# Patient Record
Sex: Female | Born: 1935 | Race: Black or African American | Hispanic: No | Marital: Single | Smoking: Former smoker
Health system: Southern US, Community
[De-identification: ages and names within clinical notes are randomized; demographics above are authoritative.]

## PROBLEM LIST (undated history)

## (undated) DIAGNOSIS — E785 Hyperlipidemia, unspecified: Secondary | ICD-10-CM

## (undated) DIAGNOSIS — I480 Paroxysmal atrial fibrillation: Secondary | ICD-10-CM

## (undated) DIAGNOSIS — I509 Heart failure, unspecified: Secondary | ICD-10-CM

## (undated) DIAGNOSIS — K922 Gastrointestinal hemorrhage, unspecified: Secondary | ICD-10-CM

## (undated) DIAGNOSIS — I5032 Chronic diastolic (congestive) heart failure: Secondary | ICD-10-CM

## (undated) DIAGNOSIS — M21372 Foot drop, left foot: Secondary | ICD-10-CM

## (undated) DIAGNOSIS — G7000 Myasthenia gravis without (acute) exacerbation: Secondary | ICD-10-CM

## (undated) DIAGNOSIS — K5790 Diverticulosis of intestine, part unspecified, without perforation or abscess without bleeding: Secondary | ICD-10-CM

## (undated) DIAGNOSIS — M109 Gout, unspecified: Secondary | ICD-10-CM

## (undated) DIAGNOSIS — I10 Essential (primary) hypertension: Secondary | ICD-10-CM

## (undated) DIAGNOSIS — M1990 Unspecified osteoarthritis, unspecified site: Secondary | ICD-10-CM

## (undated) DIAGNOSIS — N189 Chronic kidney disease, unspecified: Secondary | ICD-10-CM

## (undated) DIAGNOSIS — J45909 Unspecified asthma, uncomplicated: Secondary | ICD-10-CM

## (undated) DIAGNOSIS — I351 Nonrheumatic aortic (valve) insufficiency: Secondary | ICD-10-CM

## (undated) DIAGNOSIS — E079 Disorder of thyroid, unspecified: Secondary | ICD-10-CM

## (undated) HISTORY — DX: Chronic diastolic (congestive) heart failure: I50.32

## (undated) HISTORY — DX: Foot drop, left foot: M21.372

## (undated) HISTORY — PX: CATARACT EXTRACTION: SUR2

## (undated) HISTORY — DX: Unspecified osteoarthritis, unspecified site: M19.90

## (undated) HISTORY — PX: COLONOSCOPY: SHX174

## (undated) HISTORY — DX: Hyperlipidemia, unspecified: E78.5

## (undated) HISTORY — DX: Diverticulosis of intestine, part unspecified, without perforation or abscess without bleeding: K57.90

## (undated) HISTORY — DX: Disorder of thyroid, unspecified: E07.9

## (undated) HISTORY — DX: Gastrointestinal hemorrhage, unspecified: K92.2

## (undated) HISTORY — DX: Paroxysmal atrial fibrillation: I48.0

## (undated) HISTORY — DX: Essential (primary) hypertension: I10

## (undated) HISTORY — DX: Myasthenia gravis without (acute) exacerbation: G70.00

## (undated) HISTORY — DX: Unspecified asthma, uncomplicated: J45.909

## (undated) HISTORY — DX: Nonrheumatic aortic (valve) insufficiency: I35.1

## (undated) HISTORY — DX: Chronic kidney disease, unspecified: N18.9

## (undated) HISTORY — DX: Gout, unspecified: M10.9

## (undated) HISTORY — DX: Heart failure, unspecified: I50.9

---

## 2004-11-20 ENCOUNTER — Ambulatory Visit: Payer: Self-pay | Admitting: Family Medicine

## 2004-12-31 ENCOUNTER — Ambulatory Visit: Payer: Self-pay | Admitting: Family Medicine

## 2005-10-08 ENCOUNTER — Ambulatory Visit: Payer: Self-pay | Admitting: Nurse Practitioner

## 2006-02-25 ENCOUNTER — Ambulatory Visit: Payer: Self-pay | Admitting: Nurse Practitioner

## 2006-09-11 ENCOUNTER — Other Ambulatory Visit: Payer: Self-pay

## 2006-09-11 ENCOUNTER — Emergency Department: Payer: Self-pay | Admitting: Emergency Medicine

## 2007-03-08 ENCOUNTER — Ambulatory Visit: Payer: Self-pay | Admitting: *Deleted

## 2007-12-27 ENCOUNTER — Emergency Department: Payer: Self-pay | Admitting: Internal Medicine

## 2008-02-25 ENCOUNTER — Inpatient Hospital Stay: Payer: Self-pay | Admitting: Internal Medicine

## 2008-03-21 ENCOUNTER — Ambulatory Visit: Payer: Self-pay | Admitting: *Deleted

## 2008-10-04 ENCOUNTER — Ambulatory Visit: Payer: Self-pay | Admitting: Family Medicine

## 2008-10-22 ENCOUNTER — Ambulatory Visit: Payer: Self-pay | Admitting: Internal Medicine

## 2009-08-15 ENCOUNTER — Emergency Department: Payer: Self-pay | Admitting: Emergency Medicine

## 2009-09-30 ENCOUNTER — Ambulatory Visit: Payer: Self-pay | Admitting: Otolaryngology

## 2010-02-27 ENCOUNTER — Ambulatory Visit: Payer: Self-pay | Admitting: Neurology

## 2011-04-29 ENCOUNTER — Ambulatory Visit: Payer: Self-pay | Admitting: Orthopedic Surgery

## 2012-02-21 ENCOUNTER — Inpatient Hospital Stay: Payer: Self-pay | Admitting: Internal Medicine

## 2012-02-21 LAB — URINALYSIS, COMPLETE
Blood: NEGATIVE
Glucose,UR: NEGATIVE mg/dL (ref 0–75)
Leukocyte Esterase: NEGATIVE
Nitrite: NEGATIVE
Ph: 5 (ref 4.5–8.0)
Protein: NEGATIVE
Specific Gravity: 1.013 (ref 1.003–1.030)
Squamous Epithelial: 1

## 2012-02-21 LAB — COMPREHENSIVE METABOLIC PANEL
Albumin: 3.4 g/dL (ref 3.4–5.0)
Alkaline Phosphatase: 66 U/L (ref 50–136)
BUN: 35 mg/dL — ABNORMAL HIGH (ref 7–18)
Calcium, Total: 9.1 mg/dL (ref 8.5–10.1)
EGFR (Non-African Amer.): 34 — ABNORMAL LOW
SGOT(AST): 20 U/L (ref 15–37)
Sodium: 143 mmol/L (ref 136–145)
Total Protein: 7.3 g/dL (ref 6.4–8.2)

## 2012-02-21 LAB — CBC
HCT: 33.5 % — ABNORMAL LOW (ref 35.0–47.0)
MCH: 27.5 pg (ref 26.0–34.0)
MCV: 85 fL (ref 80–100)
Platelet: 242 10*3/uL (ref 150–440)
RDW: 15.7 % — ABNORMAL HIGH (ref 11.5–14.5)
WBC: 10 10*3/uL (ref 3.6–11.0)

## 2012-02-22 LAB — HEMOGLOBIN A1C: Hemoglobin A1C: 6.1 % (ref 4.2–6.3)

## 2012-02-22 LAB — CBC WITH DIFFERENTIAL/PLATELET
Basophil %: 0.6 %
Eosinophil %: 0.1 %
HGB: 10.5 g/dL — ABNORMAL LOW (ref 12.0–16.0)
Lymphocyte %: 17.8 %
MCH: 28 pg (ref 26.0–34.0)
MCV: 85 fL (ref 80–100)
Monocyte #: 0.6 x10 3/mm (ref 0.2–0.9)
Neutrophil %: 74.3 %
RBC: 3.75 10*6/uL — ABNORMAL LOW (ref 3.80–5.20)

## 2012-02-22 LAB — BASIC METABOLIC PANEL
Anion Gap: 9 (ref 7–16)
Calcium, Total: 9.1 mg/dL (ref 8.5–10.1)
Chloride: 109 mmol/L — ABNORMAL HIGH (ref 98–107)
Co2: 28 mmol/L (ref 21–32)
Creatinine: 1.27 mg/dL (ref 0.60–1.30)
EGFR (African American): 47 — ABNORMAL LOW
EGFR (Non-African Amer.): 40 — ABNORMAL LOW
Glucose: 95 mg/dL (ref 65–99)
Sodium: 146 mmol/L — ABNORMAL HIGH (ref 136–145)

## 2012-02-22 LAB — HEMOGLOBIN: HGB: 9.7 g/dL — ABNORMAL LOW (ref 12.0–16.0)

## 2012-02-23 LAB — CBC WITH DIFFERENTIAL/PLATELET
Basophil #: 0.1 10*3/uL (ref 0.0–0.1)
Basophil #: 0 10*3/uL (ref 0.0–0.1)
Basophil %: 0.3 %
Eosinophil #: 0 10*3/uL (ref 0.0–0.7)
Eosinophil %: 0.4 %
Eosinophil %: 0 %
HCT: 29.8 % — ABNORMAL LOW (ref 35.0–47.0)
HCT: 31.7 % — ABNORMAL LOW (ref 35.0–47.0)
HGB: 9.8 g/dL — ABNORMAL LOW (ref 12.0–16.0)
Lymphocyte %: 23.3 %
Lymphocyte %: 16.6 %
MCH: 26.8 pg (ref 26.0–34.0)
MCHC: 30.8 g/dL — ABNORMAL LOW (ref 32.0–36.0)
MCV: 86 fL (ref 80–100)
MCV: 87 fL (ref 80–100)
Monocyte #: 0.7 x10 3/mm (ref 0.2–0.9)
Monocyte %: 10.9 %
Neutrophil #: 5 10*3/uL (ref 1.4–6.5)
Neutrophil #: 6.9 10*3/uL — ABNORMAL HIGH (ref 1.4–6.5)
Neutrophil %: 64.6 %
Neutrophil %: 75.4 %
RBC: 3.48 10*6/uL — ABNORMAL LOW (ref 3.80–5.20)
RBC: 3.65 10*6/uL — ABNORMAL LOW (ref 3.80–5.20)
RDW: 15.8 % — ABNORMAL HIGH (ref 11.5–14.5)
RDW: 15.3 % — ABNORMAL HIGH (ref 11.5–14.5)
WBC: 7.7 10*3/uL (ref 3.6–11.0)
WBC: 9.1 10*3/uL (ref 3.6–11.0)

## 2012-02-24 LAB — CBC WITH DIFFERENTIAL/PLATELET
Basophil #: 0 10*3/uL (ref 0.0–0.1)
Eosinophil #: 0 10*3/uL (ref 0.0–0.7)
HCT: 28.1 % — ABNORMAL LOW (ref 35.0–47.0)
HGB: 9.1 g/dL — ABNORMAL LOW (ref 12.0–16.0)
Lymphocyte #: 2.2 10*3/uL (ref 1.0–3.6)
Lymphocyte %: 25.7 %
MCH: 27.8 pg (ref 26.0–34.0)
MCHC: 32.3 g/dL (ref 32.0–36.0)
Neutrophil #: 5.3 10*3/uL (ref 1.4–6.5)
Platelet: 202 10*3/uL (ref 150–440)

## 2012-02-24 LAB — BASIC METABOLIC PANEL
Anion Gap: 6 — ABNORMAL LOW (ref 7–16)
BUN: 19 mg/dL — ABNORMAL HIGH (ref 7–18)
Calcium, Total: 8.9 mg/dL (ref 8.5–10.1)
Chloride: 111 mmol/L — ABNORMAL HIGH (ref 98–107)
Co2: 29 mmol/L (ref 21–32)
Glucose: 93 mg/dL (ref 65–99)
Osmolality: 293 (ref 275–301)
Potassium: 3.6 mmol/L (ref 3.5–5.1)

## 2012-02-24 LAB — HEMOGLOBIN
HGB: 9.7 g/dL — ABNORMAL LOW (ref 12.0–16.0)
HGB: 9.5 g/dL — ABNORMAL LOW (ref 12.0–16.0)

## 2012-02-25 LAB — CBC WITH DIFFERENTIAL/PLATELET
Basophil #: 0 10*3/uL (ref 0.0–0.1)
Eosinophil #: 0 10*3/uL (ref 0.0–0.7)
HCT: 28.4 % — ABNORMAL LOW (ref 35.0–47.0)
Lymphocyte %: 28.1 %
MCHC: 32.2 g/dL (ref 32.0–36.0)
Monocyte %: 11.8 %
Neutrophil #: 4.6 10*3/uL (ref 1.4–6.5)
Neutrophil %: 59.4 %
RBC: 3.25 10*6/uL — ABNORMAL LOW (ref 3.80–5.20)
RDW: 15.8 % — ABNORMAL HIGH (ref 11.5–14.5)
WBC: 7.8 10*3/uL (ref 3.6–11.0)

## 2012-02-25 LAB — BASIC METABOLIC PANEL
Anion Gap: 6 — ABNORMAL LOW (ref 7–16)
Calcium, Total: 8.7 mg/dL (ref 8.5–10.1)
Chloride: 107 mmol/L (ref 98–107)
Co2: 31 mmol/L (ref 21–32)
Creatinine: 1.4 mg/dL — ABNORMAL HIGH (ref 0.60–1.30)
EGFR (African American): 42 — ABNORMAL LOW
EGFR (Non-African Amer.): 36 — ABNORMAL LOW
Potassium: 3.6 mmol/L (ref 3.5–5.1)

## 2012-02-26 LAB — BASIC METABOLIC PANEL
Anion Gap: 6 — ABNORMAL LOW (ref 7–16)
BUN: 25 mg/dL — ABNORMAL HIGH (ref 7–18)
Calcium, Total: 8.7 mg/dL (ref 8.5–10.1)
Chloride: 106 mmol/L (ref 98–107)
Co2: 31 mmol/L (ref 21–32)
Creatinine: 1.61 mg/dL — ABNORMAL HIGH (ref 0.60–1.30)
EGFR (African American): 35 — ABNORMAL LOW
EGFR (Non-African Amer.): 30 — ABNORMAL LOW
Glucose: 93 mg/dL (ref 65–99)
Osmolality: 289 (ref 275–301)
Potassium: 3.6 mmol/L (ref 3.5–5.1)
Sodium: 143 mmol/L (ref 136–145)

## 2012-02-26 LAB — CBC WITH DIFFERENTIAL/PLATELET
Basophil #: 0 10*3/uL (ref 0.0–0.1)
Basophil %: 0.4 %
Eosinophil #: 0 10*3/uL (ref 0.0–0.7)
Eosinophil %: 0.4 %
HCT: 27.1 % — ABNORMAL LOW (ref 35.0–47.0)
HGB: 8.6 g/dL — ABNORMAL LOW (ref 12.0–16.0)
Lymphocyte #: 2.1 10*3/uL (ref 1.0–3.6)
Lymphocyte %: 27.2 %
MCH: 27.6 pg (ref 26.0–34.0)
MCHC: 31.7 g/dL — ABNORMAL LOW (ref 32.0–36.0)
MCV: 87 fL (ref 80–100)
Monocyte #: 0.8 x10 3/mm (ref 0.2–0.9)
Monocyte %: 10.5 %
Neutrophil #: 4.6 10*3/uL (ref 1.4–6.5)
Neutrophil %: 61.5 %
Platelet: 186 10*3/uL (ref 150–440)
RBC: 3.11 10*6/uL — ABNORMAL LOW (ref 3.80–5.20)
RDW: 15.5 % — ABNORMAL HIGH (ref 11.5–14.5)
WBC: 7.5 10*3/uL (ref 3.6–11.0)

## 2012-02-27 LAB — CBC WITH DIFFERENTIAL/PLATELET
Basophil #: 0 10*3/uL (ref 0.0–0.1)
Eosinophil #: 0 10*3/uL (ref 0.0–0.7)
HCT: 26.9 % — ABNORMAL LOW (ref 35.0–47.0)
Lymphocyte #: 1.8 10*3/uL (ref 1.0–3.6)
Lymphocyte %: 22.5 %
MCV: 87 fL (ref 80–100)
Monocyte %: 11.4 %
Neutrophil #: 5.2 10*3/uL (ref 1.4–6.5)
Platelet: 189 10*3/uL (ref 150–440)
RBC: 3.11 10*6/uL — ABNORMAL LOW (ref 3.80–5.20)
RDW: 15.8 % — ABNORMAL HIGH (ref 11.5–14.5)
WBC: 8 10*3/uL (ref 3.6–11.0)

## 2012-02-27 LAB — BASIC METABOLIC PANEL
Calcium, Total: 8.7 mg/dL (ref 8.5–10.1)
Chloride: 106 mmol/L (ref 98–107)
Creatinine: 1.56 mg/dL — ABNORMAL HIGH (ref 0.60–1.30)
EGFR (African American): 37 — ABNORMAL LOW
EGFR (Non-African Amer.): 31 — ABNORMAL LOW
Glucose: 90 mg/dL (ref 65–99)

## 2012-03-07 LAB — BASIC METABOLIC PANEL
Anion Gap: 10 (ref 7–16)
Calcium, Total: 8.9 mg/dL (ref 8.5–10.1)
Chloride: 98 mmol/L (ref 98–107)
EGFR (African American): 24 — ABNORMAL LOW
EGFR (Non-African Amer.): 20 — ABNORMAL LOW
Glucose: 99 mg/dL (ref 65–99)
Osmolality: 291 (ref 275–301)
Sodium: 142 mmol/L (ref 136–145)

## 2012-03-07 LAB — CBC
HCT: 26.1 % — ABNORMAL LOW (ref 35.0–47.0)
HGB: 8.2 g/dL — ABNORMAL LOW (ref 12.0–16.0)
MCHC: 31.4 g/dL — ABNORMAL LOW (ref 32.0–36.0)
MCV: 87 fL (ref 80–100)
RBC: 2.99 10*6/uL — ABNORMAL LOW (ref 3.80–5.20)
WBC: 11.8 10*3/uL — ABNORMAL HIGH (ref 3.6–11.0)

## 2012-03-07 LAB — URINALYSIS, COMPLETE
Ketone: NEGATIVE
Leukocyte Esterase: NEGATIVE
Ph: 5 (ref 4.5–8.0)
Protein: NEGATIVE
Squamous Epithelial: 30

## 2012-03-08 LAB — COMPREHENSIVE METABOLIC PANEL
Albumin: 2.5 g/dL — ABNORMAL LOW (ref 3.4–5.0)
Alkaline Phosphatase: 41 U/L — ABNORMAL LOW (ref 50–136)
Anion Gap: 5 — ABNORMAL LOW (ref 7–16)
BUN: 35 mg/dL — ABNORMAL HIGH (ref 7–18)
Calcium, Total: 8.3 mg/dL — ABNORMAL LOW (ref 8.5–10.1)
EGFR (Non-African Amer.): 22 — ABNORMAL LOW
Glucose: 86 mg/dL (ref 65–99)
SGOT(AST): 15 U/L (ref 15–37)
Sodium: 144 mmol/L (ref 136–145)
Total Protein: 5.3 g/dL — ABNORMAL LOW (ref 6.4–8.2)

## 2012-03-08 LAB — CBC WITH DIFFERENTIAL/PLATELET
Basophil #: 0 10*3/uL (ref 0.0–0.1)
HCT: 22.9 % — ABNORMAL LOW (ref 35.0–47.0)
HGB: 7.3 g/dL — ABNORMAL LOW (ref 12.0–16.0)
Lymphocyte #: 1.4 10*3/uL (ref 1.0–3.6)
MCH: 28.1 pg (ref 26.0–34.0)
MCV: 88 fL (ref 80–100)
Monocyte #: 1.3 x10 3/mm — ABNORMAL HIGH (ref 0.2–0.9)
Platelet: 167 10*3/uL (ref 150–440)
RDW: 16.2 % — ABNORMAL HIGH (ref 11.5–14.5)

## 2012-03-09 ENCOUNTER — Inpatient Hospital Stay: Payer: Self-pay | Admitting: Internal Medicine

## 2012-03-09 LAB — HEMOGLOBIN: HGB: 8.2 g/dL — ABNORMAL LOW (ref 12.0–16.0)

## 2012-03-09 LAB — BASIC METABOLIC PANEL
Anion Gap: 4 — ABNORMAL LOW (ref 7–16)
BUN: 30 mg/dL — ABNORMAL HIGH (ref 7–18)
Calcium, Total: 8.3 mg/dL — ABNORMAL LOW (ref 8.5–10.1)
Chloride: 106 mmol/L (ref 98–107)
Creatinine: 1.92 mg/dL — ABNORMAL HIGH (ref 0.60–1.30)
EGFR (African American): 28 — ABNORMAL LOW
EGFR (Non-African Amer.): 25 — ABNORMAL LOW
Glucose: 102 mg/dL — ABNORMAL HIGH (ref 65–99)
Osmolality: 291 (ref 275–301)
Potassium: 4.2 mmol/L (ref 3.5–5.1)
Sodium: 143 mmol/L (ref 136–145)

## 2012-03-10 LAB — CBC WITH DIFFERENTIAL/PLATELET
Basophil %: 0.3 %
Eosinophil #: 0.1 10*3/uL (ref 0.0–0.7)
Eosinophil %: 1.1 %
HCT: 23.6 % — ABNORMAL LOW (ref 35.0–47.0)
Lymphocyte %: 12.9 %
Monocyte #: 1 x10 3/mm — ABNORMAL HIGH (ref 0.2–0.9)
Monocyte %: 11.8 %
Neutrophil #: 6.1 10*3/uL (ref 1.4–6.5)
Neutrophil %: 73.9 %
Platelet: 192 10*3/uL (ref 150–440)
RBC: 2.72 10*6/uL — ABNORMAL LOW (ref 3.80–5.20)
WBC: 8.3 10*3/uL (ref 3.6–11.0)

## 2012-03-10 LAB — BASIC METABOLIC PANEL
Anion Gap: 4 — ABNORMAL LOW (ref 7–16)
Chloride: 106 mmol/L (ref 98–107)
Co2: 32 mmol/L (ref 21–32)
Creatinine: 1.71 mg/dL — ABNORMAL HIGH (ref 0.60–1.30)
Potassium: 4.5 mmol/L (ref 3.5–5.1)
Sodium: 142 mmol/L (ref 136–145)

## 2012-03-11 LAB — CBC WITH DIFFERENTIAL/PLATELET
Basophil #: 0 10*3/uL (ref 0.0–0.1)
Basophil %: 0.4 %
HCT: 24.9 % — ABNORMAL LOW (ref 35.0–47.0)
Lymphocyte #: 1.4 10*3/uL (ref 1.0–3.6)
Lymphocyte %: 18.3 %
Monocyte %: 12.7 %
Neutrophil #: 5.2 10*3/uL (ref 1.4–6.5)
Neutrophil %: 67.4 %
Platelet: 210 10*3/uL (ref 150–440)
RDW: 15.4 % — ABNORMAL HIGH (ref 11.5–14.5)
WBC: 7.7 10*3/uL (ref 3.6–11.0)

## 2012-05-17 LAB — CBC WITH DIFFERENTIAL/PLATELET
Basophil #: 0 10*3/uL (ref 0.0–0.1)
Basophil %: 0.7 %
Eosinophil #: 0.1 10*3/uL (ref 0.0–0.7)
HGB: 9.2 g/dL — ABNORMAL LOW (ref 12.0–16.0)
Lymphocyte %: 20 %
MCH: 29.5 pg (ref 26.0–34.0)
MCHC: 33.6 g/dL (ref 32.0–36.0)
Monocyte #: 0.7 x10 3/mm (ref 0.2–0.9)
Monocyte %: 11 %
Neutrophil %: 67.4 %
Platelet: 161 10*3/uL (ref 150–440)
RBC: 3.13 10*6/uL — ABNORMAL LOW (ref 3.80–5.20)

## 2012-05-17 LAB — BASIC METABOLIC PANEL
Anion Gap: 8 (ref 7–16)
Calcium, Total: 9.3 mg/dL (ref 8.5–10.1)
Co2: 28 mmol/L (ref 21–32)
Creatinine: 1.3 mg/dL (ref 0.60–1.30)
EGFR (African American): 46 — ABNORMAL LOW
Osmolality: 291 (ref 275–301)

## 2012-05-17 LAB — PROTIME-INR: Prothrombin Time: 13.1 secs (ref 11.5–14.7)

## 2012-05-18 ENCOUNTER — Inpatient Hospital Stay: Payer: Self-pay | Admitting: Internal Medicine

## 2012-05-18 LAB — CBC WITH DIFFERENTIAL/PLATELET
Basophil %: 0.6 %
Eosinophil #: 0 10*3/uL (ref 0.0–0.7)
Eosinophil %: 0.4 %
HCT: 27.1 % — ABNORMAL LOW (ref 35.0–47.0)
HGB: 9 g/dL — ABNORMAL LOW (ref 12.0–16.0)
Lymphocyte %: 16.7 %
MCH: 29.1 pg (ref 26.0–34.0)
MCHC: 33.4 g/dL (ref 32.0–36.0)
MCV: 87 fL (ref 80–100)
Monocyte #: 0.4 x10 3/mm (ref 0.2–0.9)
Monocyte %: 6.8 %
Neutrophil #: 4.9 10*3/uL (ref 1.4–6.5)
Neutrophil %: 75.5 %
Platelet: 168 10*3/uL (ref 150–440)
RBC: 3.1 10*6/uL — ABNORMAL LOW (ref 3.80–5.20)
WBC: 6.4 10*3/uL (ref 3.6–11.0)

## 2012-05-18 LAB — BASIC METABOLIC PANEL
BUN: 17 mg/dL (ref 7–18)
Calcium, Total: 9.2 mg/dL (ref 8.5–10.1)
EGFR (Non-African Amer.): 40 — ABNORMAL LOW
Glucose: 82 mg/dL (ref 65–99)
Potassium: 4.9 mmol/L (ref 3.5–5.1)
Sodium: 143 mmol/L (ref 136–145)

## 2012-05-18 LAB — HEMOGLOBIN: HGB: 9.8 g/dL — ABNORMAL LOW (ref 12.0–16.0)

## 2012-05-19 LAB — CBC WITH DIFFERENTIAL/PLATELET
Basophil %: 0.5 %
HCT: 26.8 % — ABNORMAL LOW (ref 35.0–47.0)
HGB: 8.9 g/dL — ABNORMAL LOW (ref 12.0–16.0)
Lymphocyte %: 25.1 %
Monocyte #: 0.9 x10 3/mm (ref 0.2–0.9)
Monocyte %: 13.8 %
Platelet: 166 10*3/uL (ref 150–440)
RDW: 16.9 % — ABNORMAL HIGH (ref 11.5–14.5)
WBC: 6.7 10*3/uL (ref 3.6–11.0)

## 2012-06-09 ENCOUNTER — Ambulatory Visit: Payer: Self-pay | Admitting: Gastroenterology

## 2012-08-01 ENCOUNTER — Ambulatory Visit: Payer: Self-pay | Admitting: Gastroenterology

## 2012-08-01 LAB — CBC WITH DIFFERENTIAL/PLATELET
Basophil #: 0.1 10*3/uL (ref 0.0–0.1)
Basophil %: 0.8 %
Eosinophil #: 0.1 10*3/uL (ref 0.0–0.7)
Eosinophil %: 1.4 %
HCT: 33.1 % — ABNORMAL LOW (ref 35.0–47.0)
HGB: 10.8 g/dL — ABNORMAL LOW (ref 12.0–16.0)
MCV: 90 fL (ref 80–100)
Monocyte #: 0.7 x10 3/mm (ref 0.2–0.9)
Monocyte %: 9.5 %
Neutrophil #: 4.7 10*3/uL (ref 1.4–6.5)
RBC: 3.7 10*6/uL — ABNORMAL LOW (ref 3.80–5.20)
WBC: 7.1 10*3/uL (ref 3.6–11.0)

## 2012-08-01 LAB — BASIC METABOLIC PANEL
BUN: 18 mg/dL (ref 7–18)
Calcium, Total: 9 mg/dL (ref 8.5–10.1)
Chloride: 111 mmol/L — ABNORMAL HIGH (ref 98–107)
Co2: 29 mmol/L (ref 21–32)
EGFR (Non-African Amer.): 41 — ABNORMAL LOW
Glucose: 86 mg/dL (ref 65–99)
Osmolality: 288 (ref 275–301)
Potassium: 3.8 mmol/L (ref 3.5–5.1)

## 2012-09-11 ENCOUNTER — Emergency Department: Payer: Self-pay | Admitting: Emergency Medicine

## 2012-09-11 LAB — BASIC METABOLIC PANEL
Co2: 31 mmol/L (ref 21–32)
Creatinine: 1.14 mg/dL (ref 0.60–1.30)
Glucose: 75 mg/dL (ref 65–99)
Osmolality: 287 (ref 275–301)
Potassium: 3.8 mmol/L (ref 3.5–5.1)
Sodium: 144 mmol/L (ref 136–145)

## 2012-09-11 LAB — TROPONIN I: Troponin-I: 0.04 ng/mL

## 2012-09-11 LAB — CBC
HGB: 10.3 g/dL — ABNORMAL LOW (ref 12.0–16.0)
MCHC: 32.4 g/dL (ref 32.0–36.0)
RBC: 3.57 10*6/uL — ABNORMAL LOW (ref 3.80–5.20)
WBC: 8.8 10*3/uL (ref 3.6–11.0)

## 2012-09-11 LAB — CK TOTAL AND CKMB (NOT AT ARMC)
CK, Total: 42 U/L (ref 21–215)
CK-MB: 2.5 ng/mL (ref 0.5–3.6)

## 2012-11-17 LAB — CBC
MCH: 29.2 pg (ref 26.0–34.0)
MCHC: 32.9 g/dL (ref 32.0–36.0)
MCV: 89 fL (ref 80–100)
RBC: 3.67 10*6/uL — ABNORMAL LOW (ref 3.80–5.20)
WBC: 9.5 10*3/uL (ref 3.6–11.0)

## 2012-11-17 LAB — COMPREHENSIVE METABOLIC PANEL
Albumin: 3.1 g/dL — ABNORMAL LOW (ref 3.4–5.0)
BUN: 30 mg/dL — ABNORMAL HIGH (ref 7–18)
Bilirubin,Total: 0.3 mg/dL (ref 0.2–1.0)
Calcium, Total: 8.6 mg/dL (ref 8.5–10.1)
Chloride: 108 mmol/L — ABNORMAL HIGH (ref 98–107)
EGFR (African American): 36 — ABNORMAL LOW
Glucose: 119 mg/dL — ABNORMAL HIGH (ref 65–99)
Osmolality: 290 (ref 275–301)
Potassium: 3.7 mmol/L (ref 3.5–5.1)
SGPT (ALT): 20 U/L (ref 12–78)
Sodium: 142 mmol/L (ref 136–145)
Total Protein: 7 g/dL (ref 6.4–8.2)

## 2012-11-17 LAB — URINALYSIS, COMPLETE
Bilirubin,UR: NEGATIVE
Glucose,UR: NEGATIVE mg/dL (ref 0–75)
Ketone: NEGATIVE
Nitrite: NEGATIVE
Ph: 5 (ref 4.5–8.0)
RBC,UR: 1 /HPF (ref 0–5)
Specific Gravity: 1.013 (ref 1.003–1.030)
Squamous Epithelial: 3
WBC UR: 1 /HPF (ref 0–5)

## 2012-11-17 LAB — CK TOTAL AND CKMB (NOT AT ARMC): CK-MB: 5.3 ng/mL — ABNORMAL HIGH (ref 0.5–3.6)

## 2012-11-18 ENCOUNTER — Inpatient Hospital Stay: Payer: Self-pay | Admitting: Internal Medicine

## 2012-11-18 LAB — CBC
HCT: 30.7 % — ABNORMAL LOW (ref 35.0–47.0)
HGB: 9.8 g/dL — ABNORMAL LOW (ref 12.0–16.0)
MCH: 28.4 pg (ref 26.0–34.0)
MCHC: 32.1 g/dL (ref 32.0–36.0)
WBC: 9.7 10*3/uL (ref 3.6–11.0)

## 2012-11-19 LAB — CBC WITH DIFFERENTIAL/PLATELET
Basophil #: 0.1 10*3/uL (ref 0.0–0.1)
Eosinophil #: 0.1 10*3/uL (ref 0.0–0.7)
Eosinophil %: 1.4 %
HCT: 25.9 % — ABNORMAL LOW (ref 35.0–47.0)
Lymphocyte #: 1.7 10*3/uL (ref 1.0–3.6)
Lymphocyte %: 24.8 %
MCH: 29.5 pg (ref 26.0–34.0)
MCHC: 33.3 g/dL (ref 32.0–36.0)
Monocyte #: 0.7 x10 3/mm (ref 0.2–0.9)
Monocyte %: 10.6 %
Neutrophil %: 62.4 %
Platelet: 166 10*3/uL (ref 150–440)
RBC: 2.92 10*6/uL — ABNORMAL LOW (ref 3.80–5.20)
RDW: 15.7 % — ABNORMAL HIGH (ref 11.5–14.5)
WBC: 7 10*3/uL (ref 3.6–11.0)

## 2012-11-20 LAB — HEMATOCRIT: HCT: 29.6 % — ABNORMAL LOW (ref 35.0–47.0)

## 2012-11-21 LAB — BASIC METABOLIC PANEL
Anion Gap: 0 — ABNORMAL LOW (ref 7–16)
BUN: 12 mg/dL (ref 7–18)
Calcium, Total: 8.5 mg/dL (ref 8.5–10.1)
Co2: 34 mmol/L — ABNORMAL HIGH (ref 21–32)
Creatinine: 1.31 mg/dL — ABNORMAL HIGH (ref 0.60–1.30)
EGFR (African American): 45 — ABNORMAL LOW
Potassium: 3.7 mmol/L (ref 3.5–5.1)

## 2012-11-21 LAB — CBC WITH DIFFERENTIAL/PLATELET
Basophil #: 0 10*3/uL (ref 0.0–0.1)
Basophil %: 0.5 %
Eosinophil #: 0 10*3/uL (ref 0.0–0.7)
Lymphocyte %: 23.9 %
MCH: 29.2 pg (ref 26.0–34.0)
MCV: 90 fL (ref 80–100)
Neutrophil #: 5.3 10*3/uL (ref 1.4–6.5)
RBC: 3.1 10*6/uL — ABNORMAL LOW (ref 3.80–5.20)
RDW: 16.2 % — ABNORMAL HIGH (ref 11.5–14.5)

## 2012-11-21 LAB — HEMATOCRIT: HCT: 27.8 % — ABNORMAL LOW (ref 35.0–47.0)

## 2012-11-22 LAB — HEMOGLOBIN: HGB: 9 g/dL — ABNORMAL LOW (ref 12.0–16.0)

## 2012-11-23 LAB — BASIC METABOLIC PANEL
Anion Gap: 3 — ABNORMAL LOW (ref 7–16)
BUN: 10 mg/dL (ref 7–18)
Calcium, Total: 8.6 mg/dL (ref 8.5–10.1)
Chloride: 107 mmol/L (ref 98–107)
Co2: 36 mmol/L — ABNORMAL HIGH (ref 21–32)
Creatinine: 1.52 mg/dL — ABNORMAL HIGH (ref 0.60–1.30)
Potassium: 3.4 mmol/L — ABNORMAL LOW (ref 3.5–5.1)
Sodium: 146 mmol/L — ABNORMAL HIGH (ref 136–145)

## 2012-11-23 LAB — HEMOGLOBIN: HGB: 8.7 g/dL — ABNORMAL LOW (ref 12.0–16.0)

## 2012-11-24 LAB — BASIC METABOLIC PANEL
Anion Gap: 1 — ABNORMAL LOW (ref 7–16)
BUN: 18 mg/dL (ref 7–18)
Creatinine: 2.07 mg/dL — ABNORMAL HIGH (ref 0.60–1.30)
EGFR (African American): 26 — ABNORMAL LOW
EGFR (Non-African Amer.): 22 — ABNORMAL LOW
Glucose: 100 mg/dL — ABNORMAL HIGH (ref 65–99)
Osmolality: 283 (ref 275–301)
Sodium: 141 mmol/L (ref 136–145)

## 2012-11-24 LAB — HEMOGLOBIN: HGB: 8.5 g/dL — ABNORMAL LOW (ref 12.0–16.0)

## 2012-11-25 LAB — BASIC METABOLIC PANEL
BUN: 19 mg/dL — ABNORMAL HIGH (ref 7–18)
Chloride: 105 mmol/L (ref 98–107)
EGFR (African American): 36 — ABNORMAL LOW
EGFR (Non-African Amer.): 31 — ABNORMAL LOW
Glucose: 100 mg/dL — ABNORMAL HIGH (ref 65–99)
Osmolality: 291 (ref 275–301)
Potassium: 3.5 mmol/L (ref 3.5–5.1)
Sodium: 145 mmol/L (ref 136–145)

## 2012-11-26 LAB — BASIC METABOLIC PANEL
BUN: 24 mg/dL — ABNORMAL HIGH (ref 7–18)
Calcium, Total: 9 mg/dL (ref 8.5–10.1)
Chloride: 104 mmol/L (ref 98–107)
Creatinine: 1.52 mg/dL — ABNORMAL HIGH (ref 0.60–1.30)
EGFR (Non-African Amer.): 32 — ABNORMAL LOW
Glucose: 94 mg/dL (ref 65–99)
Potassium: 3.6 mmol/L (ref 3.5–5.1)
Sodium: 145 mmol/L (ref 136–145)

## 2012-11-26 LAB — HEMOGLOBIN: HGB: 8.6 g/dL — ABNORMAL LOW (ref 12.0–16.0)

## 2012-11-27 LAB — COMPREHENSIVE METABOLIC PANEL
Albumin: 2.9 g/dL — ABNORMAL LOW (ref 3.4–5.0)
Alkaline Phosphatase: 40 U/L — ABNORMAL LOW (ref 50–136)
Anion Gap: 1 — ABNORMAL LOW (ref 7–16)
BUN: 24 mg/dL — ABNORMAL HIGH (ref 7–18)
Calcium, Total: 8.8 mg/dL (ref 8.5–10.1)
Co2: 39 mmol/L — ABNORMAL HIGH (ref 21–32)
EGFR (African American): 36 — ABNORMAL LOW
EGFR (Non-African Amer.): 31 — ABNORMAL LOW
Glucose: 93 mg/dL (ref 65–99)
Potassium: 3.8 mmol/L (ref 3.5–5.1)
SGOT(AST): 14 U/L — ABNORMAL LOW (ref 15–37)
SGPT (ALT): 13 U/L (ref 12–78)

## 2012-11-27 LAB — CBC WITH DIFFERENTIAL/PLATELET
Basophil #: 0.1 10*3/uL (ref 0.0–0.1)
Eosinophil #: 0 10*3/uL (ref 0.0–0.7)
HCT: 27.4 % — ABNORMAL LOW (ref 35.0–47.0)
HGB: 8.6 g/dL — ABNORMAL LOW (ref 12.0–16.0)
Lymphocyte %: 23.2 %
MCH: 28.4 pg (ref 26.0–34.0)
MCHC: 31.4 g/dL — ABNORMAL LOW (ref 32.0–36.0)
MCV: 91 fL (ref 80–100)
Neutrophil #: 4.7 10*3/uL (ref 1.4–6.5)
Platelet: 196 10*3/uL (ref 150–440)

## 2012-11-28 LAB — CK TOTAL AND CKMB (NOT AT ARMC)
CK, Total: 35 U/L (ref 21–215)
CK-MB: 2 ng/mL (ref 0.5–3.6)

## 2012-11-28 LAB — BASIC METABOLIC PANEL
Anion Gap: 1 — ABNORMAL LOW (ref 7–16)
BUN: 22 mg/dL — ABNORMAL HIGH (ref 7–18)
Creatinine: 1.5 mg/dL — ABNORMAL HIGH (ref 0.60–1.30)
Osmolality: 290 (ref 275–301)
Potassium: 4.4 mmol/L (ref 3.5–5.1)
Sodium: 144 mmol/L (ref 136–145)

## 2012-11-28 LAB — CBC WITH DIFFERENTIAL/PLATELET
Basophil #: 0.1 10*3/uL (ref 0.0–0.1)
Basophil %: 0.8 %
Eosinophil #: 0 10*3/uL (ref 0.0–0.7)
HCT: 26.9 % — ABNORMAL LOW (ref 35.0–47.0)
MCH: 29.1 pg (ref 26.0–34.0)
MCV: 90 fL (ref 80–100)
Monocyte #: 0.9 x10 3/mm (ref 0.2–0.9)
Neutrophil %: 60.9 %
Platelet: 196 10*3/uL (ref 150–440)
RBC: 2.99 10*6/uL — ABNORMAL LOW (ref 3.80–5.20)
WBC: 6.9 10*3/uL (ref 3.6–11.0)

## 2012-11-28 LAB — MAGNESIUM: Magnesium: 2.1 mg/dL

## 2012-11-29 DIAGNOSIS — I4891 Unspecified atrial fibrillation: Secondary | ICD-10-CM

## 2012-11-29 DIAGNOSIS — I5031 Acute diastolic (congestive) heart failure: Secondary | ICD-10-CM

## 2012-11-29 LAB — CBC WITH DIFFERENTIAL/PLATELET
HCT: 29.4 % — ABNORMAL LOW (ref 35.0–47.0)
HGB: 9.5 g/dL — ABNORMAL LOW (ref 12.0–16.0)
Lymphocyte #: 1.8 10*3/uL (ref 1.0–3.6)
Lymphocyte %: 21.5 %
MCHC: 32.3 g/dL (ref 32.0–36.0)
Monocyte %: 12.3 %
Neutrophil #: 5.5 10*3/uL (ref 1.4–6.5)
Neutrophil %: 64.8 %
RDW: 16 % — ABNORMAL HIGH (ref 11.5–14.5)

## 2012-11-29 LAB — BASIC METABOLIC PANEL
Anion Gap: 3 — ABNORMAL LOW (ref 7–16)
Co2: 39 mmol/L — ABNORMAL HIGH (ref 21–32)
Creatinine: 1.63 mg/dL — ABNORMAL HIGH (ref 0.60–1.30)
EGFR (African American): 34 — ABNORMAL LOW
EGFR (Non-African Amer.): 30 — ABNORMAL LOW
Osmolality: 286 (ref 275–301)
Potassium: 4.3 mmol/L (ref 3.5–5.1)

## 2012-11-29 LAB — MAGNESIUM: Magnesium: 2.2 mg/dL

## 2012-11-30 LAB — CBC WITH DIFFERENTIAL/PLATELET
Basophil #: 0.1 10*3/uL (ref 0.0–0.1)
Basophil %: 0.7 %
Eosinophil #: 0 10*3/uL (ref 0.0–0.7)
Eosinophil %: 0.4 %
Lymphocyte #: 1.8 10*3/uL (ref 1.0–3.6)
MCH: 29.7 pg (ref 26.0–34.0)
MCHC: 33.1 g/dL (ref 32.0–36.0)
MCV: 90 fL (ref 80–100)
Monocyte #: 1 x10 3/mm — ABNORMAL HIGH (ref 0.2–0.9)
Neutrophil #: 5.4 10*3/uL (ref 1.4–6.5)
WBC: 8.3 10*3/uL (ref 3.6–11.0)

## 2012-11-30 LAB — BASIC METABOLIC PANEL
Anion Gap: 0 — ABNORMAL LOW (ref 7–16)
Co2: 39 mmol/L — ABNORMAL HIGH (ref 21–32)
Creatinine: 1.82 mg/dL — ABNORMAL HIGH (ref 0.60–1.30)
EGFR (Non-African Amer.): 26 — ABNORMAL LOW
Potassium: 4.3 mmol/L (ref 3.5–5.1)

## 2012-12-01 ENCOUNTER — Telehealth: Payer: Self-pay

## 2012-12-01 LAB — CBC WITH DIFFERENTIAL/PLATELET
Basophil #: 0 10*3/uL (ref 0.0–0.1)
Basophil %: 0.3 %
Eosinophil #: 0 10*3/uL (ref 0.0–0.7)
Eosinophil %: 0.3 %
HCT: 26.2 % — ABNORMAL LOW (ref 35.0–47.0)
Lymphocyte %: 22.6 %
MCH: 29.7 pg (ref 26.0–34.0)
MCHC: 33 g/dL (ref 32.0–36.0)
MCV: 90 fL (ref 80–100)
Monocyte %: 10.8 %
Neutrophil #: 5.9 10*3/uL (ref 1.4–6.5)
Neutrophil %: 66 %
Platelet: 194 10*3/uL (ref 150–440)
RBC: 2.91 10*6/uL — ABNORMAL LOW (ref 3.80–5.20)
WBC: 8.9 10*3/uL (ref 3.6–11.0)

## 2012-12-01 LAB — BASIC METABOLIC PANEL
Creatinine: 1.93 mg/dL — ABNORMAL HIGH (ref 0.60–1.30)
EGFR (Non-African Amer.): 24 — ABNORMAL LOW
Glucose: 81 mg/dL (ref 65–99)
Potassium: 4.4 mmol/L (ref 3.5–5.1)
Sodium: 141 mmol/L (ref 136–145)

## 2012-12-01 NOTE — Telephone Encounter (Signed)
Message copied by [facility], Jaleel Allen E on Thu Dec 01, 2012  2:31 PM ------      Message from: Coralee Rud      Created: Thu Dec 01, 2012  2:12 PM      Regarding: tcm;/ph       appt dr Kirke Corin 12/08/12 ------

## 2012-12-01 NOTE — Telephone Encounter (Signed)
TCM  

## 2012-12-02 NOTE — Telephone Encounter (Signed)
Patient contacted regarding discharge from [facility] on 11/1712.  Patient understands to follow up with provider Dr. Kirke Corin on 12/08/12 at 1115 at [facility]. Patient understands discharge instructions? yes Patient understands medications and regiment? yes Patient understands to bring all medications to this visit? yes  Pt says she is feeling well since hospital d/c. Denies worsening sob or edema. No palpitations Confirms compliance with meds per d/c instructions. She does admit she realized this am she had 2 RX that were written at d/c that she has not had filled. Her niece has gone to fill these now She has gone through her medication bottles from prior to admission and set aside the ones that are not on hospital list Has Home health and PT that will start soon Also has aide that stays with her most of day as well as neice She will call us prior to appt if needed

## 2012-12-05 ENCOUNTER — Encounter: Payer: Self-pay | Admitting: *Deleted

## 2012-12-07 ENCOUNTER — Encounter: Payer: Self-pay | Admitting: *Deleted

## 2012-12-08 ENCOUNTER — Ambulatory Visit (INDEPENDENT_AMBULATORY_CARE_PROVIDER_SITE_OTHER): Payer: Medicare Other | Admitting: Cardiovascular Disease

## 2012-12-08 ENCOUNTER — Encounter: Payer: Self-pay | Admitting: Cardiovascular Disease

## 2012-12-08 VITALS — BP 170/84 | HR 57 | Ht 66.0 in | Wt 126.2 lb

## 2012-12-08 DIAGNOSIS — I4891 Unspecified atrial fibrillation: Secondary | ICD-10-CM

## 2012-12-08 DIAGNOSIS — I10 Essential (primary) hypertension: Secondary | ICD-10-CM

## 2012-12-08 DIAGNOSIS — I5032 Chronic diastolic (congestive) heart failure: Secondary | ICD-10-CM

## 2012-12-08 DIAGNOSIS — I480 Paroxysmal atrial fibrillation: Secondary | ICD-10-CM

## 2012-12-08 MED ORDER — AMIODARONE HCL 200 MG PO TABS: 200.0000 mg | ORAL_TABLET | Freq: Every day | ORAL | Status: DC

## 2012-12-08 NOTE — Patient Instructions (Addendum)
Continue same medications.  Follow up in 3 months.  

## 2012-12-11 ENCOUNTER — Encounter: Payer: Self-pay | Admitting: Cardiovascular Disease

## 2012-12-11 DIAGNOSIS — I480 Paroxysmal atrial fibrillation: Secondary | ICD-10-CM | POA: Insufficient documentation

## 2012-12-11 DIAGNOSIS — I5032 Chronic diastolic (congestive) heart failure: Secondary | ICD-10-CM | POA: Insufficient documentation

## 2012-12-11 NOTE — Assessment & Plan Note (Signed)
She appears to be euvolemic at this point and is only using Lasix as needed. Recent ejection fraction was normal.

## 2012-12-11 NOTE — Progress Notes (Signed)
HPI  This is a 79 year old female who is here today for a followup visit after recent hospitalization. She presented to [facility] on April 4 with lower GI bleed felt to be due to diverticular bleed. During her hospitalization, she developed severe bradycardia with a syncopal episode. Her heart rate was noted to be in the 30s. She was on carvedilol 12.5 mg twice daily which was discontinued. Subsequently, she developed atrial fibrillation with rapid ventricular response. She was started on amiodarone with restoration of normal sinus rhythm. An echocardiogram was done which showed an ejection fraction of 60-65%, moderate aortic insufficiency and moderate pulmonary hypertension. She did have fluid overload which was treated with Lasix . She had an acute on chronic renal failure which subsequently improved. Since hospital discharge, she has been feeling well. She denies any chest pain or palpitations. She is using Lasix only as needed and reports no significant edema or orthopnea.  No Known Allergies   Current Outpatient Prescriptions on File Prior to Visit  Medication Sig Dispense Refill  . acetaminophen (MAPAP) 325 MG tablet Take 650 mg by mouth every 6 (six) hours as needed for pain.      . ferrous sulfate 325 (65 FE) MG tablet Take 325 mg by mouth 2 (two) times daily.      . furosemide (LASIX) 20 MG tablet Take 20 mg by mouth as needed.      . hydrALAZINE (APRESOLINE) 50 MG tablet Take 50 mg by mouth 2 (two) times daily.      Marland Kitchen omeprazole (PRILOSEC) 20 MG capsule Take 20 mg by mouth daily.      . predniSONE (DELTASONE) 10 MG tablet Take 10 mg by mouth daily.      Marland Kitchen pyridostigmine (MESTINON) 60 MG tablet Take 30 mg by mouth 3 (three) times daily.       No current facility-administered medications on file prior to visit.     Past Medical History  Diagnosis Date  . Asthma   . Diastolic dysfunction   . Chronic kidney disease   . Myasthenia gravis   . Diverticulosis   . Hyperlipidemia   .  Hypertension   . CHF (congestive heart failure)   . Arthritis   . Paroxysmal atrial fibrillation   . Chronic diastolic heart failure     With moderate aortic valve insufficiency and moderate pulmonary hypertension     Past Surgical History  Procedure Laterality Date  . Cataract extraction    . Colonoscopy       Family History  Problem Relation Age of Onset  . Heart disease Sister   . Hypertension Mother   . Hyperlipidemia Mother   . Hypertension Father   . Hyperlipidemia Father      History   Social History  . Marital Status: Single    Spouse Name: N/A    Number of Children: N/A  . Years of Education: N/A   Occupational History  . Not on file.   Social History Main Topics  . Smoking status: Former Smoker -- 0.50 packs/day for 10 years    Types: Cigarettes  . Smokeless tobacco: Not on file  . Alcohol Use: No  . Drug Use: No  . Sexually Active: Not on file   Other Topics Concern  . Not on file   Social History Narrative  . No narrative on file     PHYSICAL EXAM   BP 170/84  Pulse 57  Ht 5\' 6"  (1.676 m)  Wt 126 lb 4  oz (57.267 kg)  BMI 20.39 kg/m2 Constitutional: She is oriented to person, place, and time. She appears well-developed and well-nourished. No distress.  HENT: No nasal discharge.  Head: Normocephalic and atraumatic.  Eyes: Pupils are equal and round. Right eye exhibits no discharge. Left eye exhibits no discharge.  Neck: Normal range of motion. Neck supple. No JVD present. No thyromegaly present.  Cardiovascular: Normal rate, regular rhythm, normal heart sounds. Exam reveals no gallop and no friction rub. No murmur heard.  Pulmonary/Chest: Effort normal and breath sounds normal. No stridor. No respiratory distress. She has no wheezes. She has no rales. She exhibits no tenderness.  Abdominal: Soft. Bowel sounds are normal. She exhibits no distension. There is no tenderness. There is no rebound and no guarding.  Musculoskeletal: Normal range  of motion. She exhibits no edema and no tenderness.  Neurological: She is alert and oriented to person, place, and time. Coordination normal.  Skin: Skin is warm and dry. No rash noted. She is not diaphoretic. No erythema. No pallor.  Psychiatric: She has a normal mood and affect. Her behavior is normal. Judgment and thought content normal.     [id number]:[id number]  Bradycardia  -  No significant ST or T wave changes. Normal QT interval.   ASSESSMENT AND PLAN

## 2012-12-11 NOTE — Assessment & Plan Note (Addendum)
She is currently in normal sinus rhythm with amiodarone. This will be continued at the current dose. This happened recently in the setting of significant GI bleed. Thus, anticoagulation is contraindicated at this point. However, if she develops recurrent atrial fibrillation in the future with no recurrent GI bleed, we might have to consider starting anticoagulation.  She did have significant bradycardia while she was on carvedilol 12.5 mg twice daily. If she develops further symptomatic bradycardia, she might require a pacemaker but this is currently not indicated.

## 2013-02-28 ENCOUNTER — Encounter: Payer: Self-pay | Admitting: Neurology

## 2013-02-28 ENCOUNTER — Ambulatory Visit (INDEPENDENT_AMBULATORY_CARE_PROVIDER_SITE_OTHER): Payer: Medicare Other | Admitting: Neurology

## 2013-02-28 VITALS — BP 188/78 | HR 72 | Ht 66.0 in | Wt 120.0 lb

## 2013-02-28 DIAGNOSIS — M216X9 Other acquired deformities of unspecified foot: Secondary | ICD-10-CM

## 2013-02-28 DIAGNOSIS — M21372 Foot drop, left foot: Secondary | ICD-10-CM

## 2013-02-28 DIAGNOSIS — G7000 Myasthenia gravis without (acute) exacerbation: Secondary | ICD-10-CM

## 2013-02-28 HISTORY — DX: Foot drop, left foot: M21.372

## 2013-02-28 MED ORDER — PREDNISONE 2.5 MG PO TABS: 7.5000 mg | ORAL_TABLET | Freq: Every day | ORAL | Status: DC

## 2013-02-28 NOTE — Progress Notes (Signed)
Reason for visit: Myasthenia gravis  [name] Duffy is an 79 y.o. female  History of present illness:  [name] Duffy is a 79 year old right-handed white female with a history of myasthenia gravis. The patient has mainly ocular features, and she has done well with her disease since last seen. The patient denies any significant problems with ptosis, or double vision. The patient denies problems with chewing or swallowing or any weakness of the extremities. The patient however, did note onset of a painless left footdrop that began approximately 2 weeks ago. The patient is able to walk with a walker. The patient was in the hospital in March of 2014 at [facility]  with congestive heart failure and a diverticular bleed with a lower GI blood loss. The patient is on Lasix, and she is having to follow her weights on a daily basis and manage her fluids carefully. The patient returns for an evaluation.  Past Medical History  Diagnosis Date  . Asthma   . Diastolic dysfunction   . Chronic kidney disease   . Myasthenia gravis   . Diverticulosis   . Hyperlipidemia   . Hypertension   . CHF (congestive heart failure)   . Arthritis   . Paroxysmal atrial fibrillation   . Chronic diastolic heart failure     With moderate aortic valve insufficiency and moderate pulmonary hypertension  . Lower GI bleed     due to diverticulosis  . Left foot drop 02/28/2013    Past Surgical History  Procedure Laterality Date  . Cataract extraction    . Colonoscopy      Family History  Problem Relation Age of Onset  . Heart disease Sister   . Hypertension Mother   . Hyperlipidemia Mother   . Hypertension Father   . Hyperlipidemia Father     Social history:  reports that she has quit smoking. Her smoking use included Cigarettes. She has a 5 pack-year smoking history. She does not have any smokeless tobacco history on file. She reports that she does not drink alcohol or use illicit  drugs.  Allergies: No Known Allergies  Medications:  Current Outpatient Prescriptions on File Prior to Visit  Medication Sig Dispense Refill  . acetaminophen (MAPAP) 325 MG tablet Take 650 mg by mouth every 6 (six) hours as needed for pain.      Marland Kitchen amiodarone (PACERONE) 200 MG tablet Take 1 tablet (200 mg total) by mouth daily.  30 tablet  6  . docusate sodium (COLACE) 100 MG capsule Take 100 mg by mouth 2 (two) times daily.       . ferrous sulfate 325 (65 FE) MG tablet Take 325 mg by mouth 2 (two) times daily.      . furosemide (LASIX) 20 MG tablet Take 20 mg by mouth as needed.      . hydrALAZINE (APRESOLINE) 50 MG tablet Take 50 mg by mouth 2 (two) times daily.      Marland Kitchen omeprazole (PRILOSEC) 20 MG capsule Take 20 mg by mouth daily.      Marland Kitchen pyridostigmine (MESTINON) 60 MG tablet Take 30 mg by mouth 3 (three) times daily.      . traMADol (ULTRAM) 50 MG tablet Take 50 mg by mouth as needed.        No current facility-administered medications on file prior to visit.    ROS:  Out of a complete 14 system review of symptoms, the patient complains only of the following symptoms, and all other reviewed systems are  negative.  Weight loss Moles Feeling hot Joint pain Runny nose Headache, numbness, weakness Insomnia Decreased appetite  Blood pressure 188/78, pulse 72, height 5\' 6"  (1.676 m), weight 120 lb (54.432 kg).  Physical Exam  General: The patient is alert and cooperative at the time of the examination.  Skin: 1-2+ edema at the ankles is noted bilaterally.   Neurologic Exam  Cranial nerves: Facial symmetry is  not present.Mild left ptosis is noted  Speech is normal, no aphasia or dysarthria is noted. Extraocular movements are full. Visual fields are full. With the eyes deviated superiorly for 1 minute, the patient has no increase in ptosis, or divergence of gaze, or reports of subjective double vision.  Motor: The patient has good strength in all 4 extremities, with exception  of a prominent left footdrop. With the arms outstretched for 1 minute, the patient has no fatigable weakness of the deltoid muscles.  Coordination: The patient has good finger-nose-finger and heel-to-shin bilaterally.  Gait and station: The patient has a  slightly wide-based, unsteady gait. The patient uses a walker for ambulation. Tandem gait was not attempted. Romberg is negative. No drift is seen.  Reflexes: Deep tendon reflexes are symmetric.   Assessment/Plan:  One. Myasthenia gravis with ocular features  2. Left footdrop  The patient is doing well with her myasthenia gravis. The patient will be reduced on her prednisone dose taking 7.5 mg daily. The patient will continue the Mestinon at 30 mg 3 times daily. The patient will followup in about 6 months. The patient is having a lot of other medical issues at this time.  Marlan Palau MD 02/28/2013 7:20 PM  [facility] [address] [place], [place] [address]  Phone [phone] Fax [phone]

## 2013-03-10 ENCOUNTER — Ambulatory Visit (INDEPENDENT_AMBULATORY_CARE_PROVIDER_SITE_OTHER): Payer: Medicare Other | Admitting: Cardiovascular Disease

## 2013-03-10 ENCOUNTER — Encounter: Payer: Self-pay | Admitting: Cardiovascular Disease

## 2013-03-10 VITALS — BP 192/94 | HR 66 | Ht 66.0 in | Wt 120.8 lb

## 2013-03-10 DIAGNOSIS — I4891 Unspecified atrial fibrillation: Secondary | ICD-10-CM

## 2013-03-10 DIAGNOSIS — I5032 Chronic diastolic (congestive) heart failure: Secondary | ICD-10-CM

## 2013-03-10 DIAGNOSIS — I480 Paroxysmal atrial fibrillation: Secondary | ICD-10-CM

## 2013-03-10 DIAGNOSIS — I10 Essential (primary) hypertension: Secondary | ICD-10-CM | POA: Insufficient documentation

## 2013-03-10 MED ORDER — HYDRALAZINE HCL 100 MG PO TABS: 100.0000 mg | ORAL_TABLET | Freq: Two times a day (BID) | ORAL | Status: DC

## 2013-03-10 MED ORDER — AMLODIPINE BESYLATE 5 MG PO TABS: 5.0000 mg | ORAL_TABLET | Freq: Every day | ORAL | Status: DC

## 2013-03-10 NOTE — Assessment & Plan Note (Signed)
She continues to be in normal sinus rhythm on amiodarone. No bradycardia since she was taken off carvedilol.  No anticoagulation is recommended for now due to recent GI bleed. I recommend checking liver profile and thyroid function every 6 months given that she is on amiodarone. She reports having labs done yesterday and I will request these.

## 2013-03-10 NOTE — Patient Instructions (Addendum)
Increase Hydralazine to 100 mg twice daily.  Start Amlodipine 5 mg once daily.  Continue other medications.  Monitor your blood pressure at home and call us with readings in 1 week  Follow up in 3 months.

## 2013-03-10 NOTE — Assessment & Plan Note (Signed)
She appears to be euvolemic. She uses Lasix 20 mg once daily as needed.

## 2013-03-10 NOTE — Progress Notes (Signed)
HPI  This is a 79 year old female who is here today for a followup visit regarding paroxysmal atrial fibrillation and hypertension. She presented to [facility] on April 4 with lower GI bleed felt to be due to diverticular bleed. During her hospitalization, she developed severe bradycardia with a syncopal episode. Her heart rate was noted to be in the 30s. She was on carvedilol 12.5 mg twice daily which was discontinued. Subsequently, she developed atrial fibrillation with rapid ventricular response. She was started on amiodarone with restoration of normal sinus rhythm. An echocardiogram was done which showed an ejection fraction of 60-65%, moderate aortic insufficiency and moderate pulmonary hypertension. She did have fluid overload which was treated with Lasix . She had an acute on chronic renal failure which subsequently improved. She She has been doing reasonably well. She denies any chest pain or palpitations. Blood pressure is still running high. She denies dizziness, syncope or presyncope.  No Known Allergies   Current Outpatient Prescriptions on File Prior to Visit  Medication Sig Dispense Refill  . acetaminophen (MAPAP) 325 MG tablet Take 650 mg by mouth every 6 (six) hours as needed for pain.      Marland Kitchen amiodarone (PACERONE) 200 MG tablet Take 1 tablet (200 mg total) by mouth daily.  30 tablet  6  . docusate sodium (COLACE) 100 MG capsule Take 100 mg by mouth 2 (two) times daily.       . ferrous sulfate 325 (65 FE) MG tablet Take 325 mg by mouth 2 (two) times daily.      . furosemide (LASIX) 20 MG tablet Take 20 mg by mouth as needed.      . hydrALAZINE (APRESOLINE) 50 MG tablet Take 50 mg by mouth 2 (two) times daily.      Marland Kitchen levalbuterol (XOPENEX HFA) 45 MCG/ACT inhaler Inhale 1-2 puffs into the lungs every 6 (six) hours as needed for wheezing.      . Multiple Vitamin (MULTIVITAMIN) tablet Take 1 tablet by mouth daily.      Marland Kitchen omeprazole (PRILOSEC) 20 MG capsule Take 20 mg by mouth daily.        Marland Kitchen pyridostigmine (MESTINON) 60 MG tablet Take 30 mg by mouth 3 (three) times daily.      . traMADol (ULTRAM) 50 MG tablet Take 50 mg by mouth as needed.        No current facility-administered medications on file prior to visit.     Past Medical History  Diagnosis Date  . Asthma   . Diastolic dysfunction   . Chronic kidney disease   . Myasthenia gravis   . Diverticulosis   . Hyperlipidemia   . Hypertension   . CHF (congestive heart failure)   . Arthritis   . Paroxysmal atrial fibrillation   . Chronic diastolic heart failure     With moderate aortic valve insufficiency and moderate pulmonary hypertension  . Lower GI bleed     due to diverticulosis  . Left foot drop 02/28/2013     Past Surgical History  Procedure Laterality Date  . Cataract extraction    . Colonoscopy       Family History  Problem Relation Age of Onset  . Heart disease Sister   . Hypertension Mother   . Hyperlipidemia Mother   . Hypertension Father   . Hyperlipidemia Father      History   Social History  . Marital Status: Single    Spouse Name: N/A    Number of Children: N/A  .  Years of Education: N/A   Occupational History  . Not on file.   Social History Main Topics  . Smoking status: Former Smoker -- 0.50 packs/day for 10 years    Types: Cigarettes  . Smokeless tobacco: Not on file  . Alcohol Use: No  . Drug Use: No  . Sexually Active: Not on file   Other Topics Concern  . Not on file   Social History Narrative  . No narrative on file     PHYSICAL EXAM   BP 192/94  Pulse 66  Ht 5\' 6"  (1.676 m)  Wt 120 lb 12 oz (54.772 kg)  BMI 19.5 kg/m2 Constitutional: She is oriented to person, place, and time. She appears well-developed and well-nourished. No distress.  HENT: No nasal discharge.  Head: Normocephalic and atraumatic.  Eyes: Pupils are equal and round. Right eye exhibits no discharge. Left eye exhibits no discharge.  Neck: Normal range of motion. Neck supple. No JVD  present. No thyromegaly present.  Cardiovascular: Normal rate, regular rhythm, normal heart sounds. Exam reveals no gallop and no friction rub. No murmur heard.  Pulmonary/Chest: Effort normal and breath sounds normal. No stridor. No respiratory distress. She has no wheezes. She has no rales. She exhibits no tenderness.  Abdominal: Soft. Bowel sounds are normal. She exhibits no distension. There is no tenderness. There is no rebound and no guarding.  Musculoskeletal: Normal range of motion. She exhibits no edema and no tenderness.  Neurological: She is alert and oriented to person, place, and time. Coordination normal.  Skin: Skin is warm and dry. No rash noted. She is not diaphoretic. No erythema. No pallor.  Psychiatric: She has a normal mood and affect. Her behavior is normal. Judgment and thought content normal.     EKG: Sinus  Rhythm  WITHIN NORMAL LIMITS   ASSESSMENT AND PLAN

## 2013-03-10 NOTE — Assessment & Plan Note (Signed)
Blood pressure is very elevated today and was noted to be elevated during her most recent visits with me. I increased hydralazine to 100 mg twice daily and added amlodipine 5 mg once daily. If her renal function is stable, we should consider resuming an ACE inhibitor. A thiazide diuretic is also an option. She would continue to monitor her blood pressure at home and call us within one week with the numbers.

## 2013-03-13 ENCOUNTER — Ambulatory Visit: Payer: Self-pay | Admitting: Family Medicine

## 2013-03-14 ENCOUNTER — Telehealth: Payer: Self-pay

## 2013-03-14 NOTE — Telephone Encounter (Signed)
I spoke with the patient. She has been taking her BP daily and reports her readings are averaging around what today's reading is. She is feeling well with her medication changes. I advised I would forward to Dr. Kirke Corin. We will call her back should her have further recommendations. She is agreeable.

## 2013-03-14 NOTE — Telephone Encounter (Signed)
Pt  Calling with BP readings.  131/56 taken this morning

## 2013-03-15 NOTE — Telephone Encounter (Signed)
Continue same medications.

## 2013-03-21 ENCOUNTER — Telehealth: Payer: Self-pay | Admitting: Neurology

## 2013-03-23 NOTE — Telephone Encounter (Signed)
I called patient. The patient is having onset of bilateral thigh pain, with pain in the muscle with weightbearing only. The patient denies any hip discomfort. We will go back to the 10 mg dose of prednisone, and in the future taper by 1 mg increments. The patient will call me if the pain does not get better.

## 2013-03-23 NOTE — Telephone Encounter (Signed)
Patient states she was put on prednisone 10mg , then it was changed to 2.5 mg(3 tablets a day) she feel that it is not working. Feels she needs to go back to 10 mg. Her thighs are really sore. Patient was last seen on 02-28-2013 best contact is [phone] (H)

## 2013-04-11 ENCOUNTER — Telehealth: Payer: Self-pay

## 2013-04-11 NOTE — Telephone Encounter (Signed)
Has question about pt hydralazine. Please call.pt waiting

## 2013-04-11 NOTE — Telephone Encounter (Signed)
Spoke with Pattie at the [facility], per Susan's note wanted to verify pt's current dosage of Hydralazine rx by Dr Kirke Corin.  Advised per our med list pt is supposed to be taking 100mg  BID.  Pt presented to [facility] clinic today with multiple bottles of different strengths of Hydralazine.  They disposed of old bottles of Hydralazine and instructed pt not to refill until out of current rx.  Requested faxed copy of Dr Jari Sportsman last office note.  Note faxed in EPIC.

## 2013-04-19 ENCOUNTER — Ambulatory Visit: Payer: Self-pay | Admitting: Gastroenterology

## 2013-05-09 ENCOUNTER — Observation Stay: Payer: Self-pay | Admitting: Internal Medicine

## 2013-05-09 LAB — CBC
MCH: 30.1 pg (ref 26.0–34.0)
Platelet: 231 10*3/uL (ref 150–440)
RBC: 3.77 10*6/uL — ABNORMAL LOW (ref 3.80–5.20)
RDW: 16.6 % — ABNORMAL HIGH (ref 11.5–14.5)

## 2013-05-09 LAB — CK TOTAL AND CKMB (NOT AT ARMC)
CK, Total: 54 U/L (ref 21–215)
CK, Total: 42 U/L (ref 21–215)
CK-MB: 3.3 ng/mL (ref 0.5–3.6)
CK-MB: 2.3 ng/mL (ref 0.5–3.6)

## 2013-05-09 LAB — BASIC METABOLIC PANEL
Calcium, Total: 9.3 mg/dL (ref 8.5–10.1)
Chloride: 112 mmol/L — ABNORMAL HIGH (ref 98–107)
Co2: 26 mmol/L (ref 21–32)
EGFR (Non-African Amer.): 30 — ABNORMAL LOW
Osmolality: 289 (ref 275–301)
Potassium: 5.7 mmol/L — ABNORMAL HIGH (ref 3.5–5.1)

## 2013-05-09 LAB — TROPONIN I: Troponin-I: <0.02 ng/mL

## 2013-05-09 LAB — PRO B NATRIURETIC PEPTIDE: B-Type Natriuretic Peptide: 549 pg/mL — ABNORMAL HIGH (ref 0–450)

## 2013-05-10 DIAGNOSIS — R079 Chest pain, unspecified: Secondary | ICD-10-CM

## 2013-05-10 LAB — BASIC METABOLIC PANEL
Anion Gap: 4 — ABNORMAL LOW (ref 7–16)
BUN: 29 mg/dL — ABNORMAL HIGH (ref 7–18)
Calcium, Total: 8.6 mg/dL (ref 8.5–10.1)
Chloride: 112 mmol/L — ABNORMAL HIGH (ref 98–107)
EGFR (African American): 34 — ABNORMAL LOW
EGFR (Non-African Amer.): 29 — ABNORMAL LOW
Glucose: 77 mg/dL (ref 65–99)
Osmolality: 290 (ref 275–301)
Potassium: 4.3 mmol/L (ref 3.5–5.1)
Sodium: 143 mmol/L (ref 136–145)

## 2013-05-10 LAB — CBC WITH DIFFERENTIAL/PLATELET
Basophil %: 1 %
Eosinophil #: 0 10*3/uL (ref 0.0–0.7)
Eosinophil %: 0.5 %
HGB: 10.2 g/dL — ABNORMAL LOW (ref 12.0–16.0)
Lymphocyte #: 1.7 10*3/uL (ref 1.0–3.6)
Lymphocyte %: 26.8 %
MCH: 30.2 pg (ref 26.0–34.0)
Monocyte #: 0.7 x10 3/mm (ref 0.2–0.9)
Monocyte %: 10.5 %
Neutrophil #: 3.8 10*3/uL (ref 1.4–6.5)
Neutrophil %: 61.2 %
RBC: 3.38 10*6/uL — ABNORMAL LOW (ref 3.80–5.20)
RDW: 16.5 % — ABNORMAL HIGH (ref 11.5–14.5)
WBC: 6.3 10*3/uL (ref 3.6–11.0)

## 2013-05-10 LAB — LIPID PANEL
HDL Cholesterol: 95 mg/dL — ABNORMAL HIGH (ref 40–60)
Ldl Cholesterol, Calc: 135 mg/dL — ABNORMAL HIGH (ref 0–100)
VLDL Cholesterol, Calc: 21 mg/dL (ref 5–40)

## 2013-05-10 LAB — CK TOTAL AND CKMB (NOT AT ARMC): CK, Total: 39 U/L (ref 21–215)

## 2013-05-10 LAB — T4, FREE: Free Thyroxine: 0.66 ng/dL — ABNORMAL LOW (ref 0.76–1.46)

## 2013-05-10 LAB — MAGNESIUM: Magnesium: 2.1 mg/dL

## 2013-05-18 ENCOUNTER — Other Ambulatory Visit: Payer: Self-pay | Admitting: Neurology

## 2013-05-25 ENCOUNTER — Other Ambulatory Visit: Payer: Self-pay | Admitting: Neurology

## 2013-05-26 ENCOUNTER — Other Ambulatory Visit: Payer: Self-pay

## 2013-05-26 MED ORDER — TRAMADOL HCL 50 MG PO TABS: 50.0000 mg | ORAL_TABLET | ORAL | Status: DC | PRN

## 2013-05-28 LAB — URINALYSIS, COMPLETE
Glucose,UR: NEGATIVE mg/dL (ref 0–75)
Ketone: NEGATIVE
Leukocyte Esterase: NEGATIVE
Nitrite: NEGATIVE
Ph: 5 (ref 4.5–8.0)
Protein: 30
RBC,UR: 1 /HPF (ref 0–5)
Squamous Epithelial: 4
WBC UR: 1 /HPF (ref 0–5)

## 2013-05-28 LAB — COMPREHENSIVE METABOLIC PANEL
Alkaline Phosphatase: 66 U/L (ref 50–136)
Anion Gap: 6 — ABNORMAL LOW (ref 7–16)
BUN: 41 mg/dL — ABNORMAL HIGH (ref 7–18)
Calcium, Total: 9.1 mg/dL (ref 8.5–10.1)
Co2: 26 mmol/L (ref 21–32)
Creatinine: 2.56 mg/dL — ABNORMAL HIGH (ref 0.60–1.30)
EGFR (Non-African Amer.): 17 — ABNORMAL LOW
Glucose: 74 mg/dL (ref 65–99)
Osmolality: 288 (ref 275–301)
SGPT (ALT): 15 U/L (ref 12–78)
Sodium: 140 mmol/L (ref 136–145)
Total Protein: 6.6 g/dL (ref 6.4–8.2)

## 2013-05-28 LAB — CBC
HCT: 28.4 % — ABNORMAL LOW (ref 35.0–47.0)
HGB: 9.6 g/dL — ABNORMAL LOW (ref 12.0–16.0)
MCHC: 33.8 g/dL (ref 32.0–36.0)
Platelet: 179 10*3/uL (ref 150–440)
WBC: 11.9 10*3/uL — ABNORMAL HIGH (ref 3.6–11.0)

## 2013-05-28 LAB — PRO B NATRIURETIC PEPTIDE: B-Type Natriuretic Peptide: 818 pg/mL — ABNORMAL HIGH (ref 0–450)

## 2013-05-28 LAB — TROPONIN I: Troponin-I: <0.02 ng/mL

## 2013-05-29 ENCOUNTER — Inpatient Hospital Stay: Payer: Self-pay | Admitting: Internal Medicine

## 2013-05-29 LAB — BASIC METABOLIC PANEL
BUN: 42 mg/dL — ABNORMAL HIGH (ref 7–18)
Co2: 25 mmol/L (ref 21–32)
Creatinine: 2.33 mg/dL — ABNORMAL HIGH (ref 0.60–1.30)
EGFR (Non-African Amer.): 19 — ABNORMAL LOW
Osmolality: 297 (ref 275–301)
Sodium: 144 mmol/L (ref 136–145)

## 2013-05-29 LAB — CBC WITH DIFFERENTIAL/PLATELET
Basophil %: 0.6 %
Eosinophil #: 0 10*3/uL (ref 0.0–0.7)
Eosinophil %: 0.3 %
HCT: 28.3 % — ABNORMAL LOW (ref 35.0–47.0)
HGB: 9.7 g/dL — ABNORMAL LOW (ref 12.0–16.0)
MCH: 30.9 pg (ref 26.0–34.0)
RBC: 3.13 10*6/uL — ABNORMAL LOW (ref 3.80–5.20)

## 2013-05-29 LAB — URIC ACID: Uric Acid: 8.1 mg/dL — ABNORMAL HIGH (ref 2.6–6.0)

## 2013-05-29 LAB — TSH: Thyroid Stimulating Horm: 12.1 u[IU]/mL — ABNORMAL HIGH

## 2013-05-29 LAB — T4, FREE: Free Thyroxine: 0.9 ng/dL (ref 0.76–1.46)

## 2013-05-30 LAB — BASIC METABOLIC PANEL
Anion Gap: 7 (ref 7–16)
BUN: 33 mg/dL — ABNORMAL HIGH (ref 7–18)
Calcium, Total: 9.3 mg/dL (ref 8.5–10.1)
Chloride: 114 mmol/L — ABNORMAL HIGH (ref 98–107)
Co2: 24 mmol/L (ref 21–32)
Creatinine: 2.23 mg/dL — ABNORMAL HIGH (ref 0.60–1.30)
EGFR (African American): 24 — ABNORMAL LOW
Glucose: 91 mg/dL (ref 65–99)
Osmolality: 296 (ref 275–301)
Sodium: 145 mmol/L (ref 136–145)

## 2013-05-30 LAB — CBC WITH DIFFERENTIAL/PLATELET
Basophil %: 0.3 %
Eosinophil %: 0.1 %
Lymphocyte %: 11 %
MCH: 31 pg (ref 26.0–34.0)
MCHC: 34.1 g/dL (ref 32.0–36.0)
MCV: 91 fL (ref 80–100)
Monocyte %: 12.9 %
Neutrophil #: 8.2 10*3/uL — ABNORMAL HIGH (ref 1.4–6.5)
Neutrophil %: 75.7 %
Platelet: 197 10*3/uL (ref 150–440)
RDW: 15.5 % — ABNORMAL HIGH (ref 11.5–14.5)

## 2013-05-30 LAB — SYNOVIAL CELL COUNT + DIFF, W/ CRYSTALS
Eosinophil: 0 %
Lymphocytes: 2 %
Neutrophils: 92 %
Other Cells BF: 0 %

## 2013-05-31 LAB — BASIC METABOLIC PANEL
BUN: 32 mg/dL — ABNORMAL HIGH (ref 7–18)
Co2: 23 mmol/L (ref 21–32)
EGFR (African American): 23 — ABNORMAL LOW
EGFR (Non-African Amer.): 20 — ABNORMAL LOW
Osmolality: 301 (ref 275–301)

## 2013-06-01 LAB — BASIC METABOLIC PANEL
BUN: 36 mg/dL — ABNORMAL HIGH (ref 7–18)
Calcium, Total: 9.3 mg/dL (ref 8.5–10.1)
Co2: 24 mmol/L (ref 21–32)
EGFR (African American): 27 — ABNORMAL LOW
Glucose: 100 mg/dL — ABNORMAL HIGH (ref 65–99)
Osmolality: 293 (ref 275–301)
Sodium: 143 mmol/L (ref 136–145)

## 2013-06-02 LAB — CULTURE, BLOOD (SINGLE)

## 2013-06-03 LAB — BODY FLUID CULTURE

## 2013-06-15 ENCOUNTER — Ambulatory Visit: Payer: Medicare Other | Admitting: Cardiovascular Disease

## 2013-06-15 ENCOUNTER — Encounter: Payer: Self-pay | Admitting: *Deleted

## 2013-06-20 ENCOUNTER — Telehealth: Payer: Self-pay

## 2013-06-20 ENCOUNTER — Ambulatory Visit (INDEPENDENT_AMBULATORY_CARE_PROVIDER_SITE_OTHER): Payer: Medicare Other | Admitting: Physician Assistant

## 2013-06-20 ENCOUNTER — Encounter: Payer: Self-pay | Admitting: Physician Assistant

## 2013-06-20 ENCOUNTER — Emergency Department: Payer: Self-pay | Admitting: Emergency Medicine

## 2013-06-20 VITALS — BP 140/60 | HR 68 | Ht 66.0 in | Wt 133.4 lb

## 2013-06-20 DIAGNOSIS — I4891 Unspecified atrial fibrillation: Secondary | ICD-10-CM

## 2013-06-20 DIAGNOSIS — I480 Paroxysmal atrial fibrillation: Secondary | ICD-10-CM

## 2013-06-20 DIAGNOSIS — I10 Essential (primary) hypertension: Secondary | ICD-10-CM

## 2013-06-20 DIAGNOSIS — I700 Atherosclerosis of aorta: Secondary | ICD-10-CM | POA: Insufficient documentation

## 2013-06-20 DIAGNOSIS — I5033 Acute on chronic diastolic (congestive) heart failure: Secondary | ICD-10-CM

## 2013-06-20 DIAGNOSIS — N183 Chronic kidney disease, stage 3 unspecified: Secondary | ICD-10-CM

## 2013-06-20 DIAGNOSIS — I509 Heart failure, unspecified: Secondary | ICD-10-CM

## 2013-06-20 DIAGNOSIS — R0602 Shortness of breath: Secondary | ICD-10-CM

## 2013-06-20 LAB — CBC
HCT: 25.9 % — ABNORMAL LOW (ref 35.0–47.0)
HGB: 8.7 g/dL — ABNORMAL LOW (ref 12.0–16.0)
MCH: 30.7 pg (ref 26.0–34.0)
MCHC: 33.7 g/dL (ref 32.0–36.0)
Platelet: 150 10*3/uL (ref 150–440)
RBC: 2.85 10*6/uL — ABNORMAL LOW (ref 3.80–5.20)
WBC: 10.1 10*3/uL (ref 3.6–11.0)

## 2013-06-20 LAB — COMPREHENSIVE METABOLIC PANEL
Albumin: 2.9 g/dL — ABNORMAL LOW (ref 3.4–5.0)
Anion Gap: 3 — ABNORMAL LOW (ref 7–16)
BUN: 19 mg/dL — ABNORMAL HIGH (ref 7–18)
Calcium, Total: 8.9 mg/dL (ref 8.5–10.1)
Chloride: 112 mmol/L — ABNORMAL HIGH (ref 98–107)
Co2: 28 mmol/L (ref 21–32)
Osmolality: 287 (ref 275–301)
Potassium: 4.6 mmol/L (ref 3.5–5.1)
SGPT (ALT): 15 U/L (ref 12–78)

## 2013-06-20 NOTE — Progress Notes (Signed)
Patient ID: [name] Duffy, female   DOB: 1932/03/27, 79 y.o.   MRN: [id number]   Date:  06/20/2013   ID:  Hinton Rao, DOB 1932/05/03, MRN [id number]  PCP:  Leanna Sato, MD  Primary Cardiologist:  Judie Petit. Kirke Corin, MD   History of Present Illness:  [name] Duffy is a 79 y.o. African American female w/ PMHx s/f PAF, possible tachy-brady syndrome, chronic diastolic CHF, moderate AI, HTN, h/o GIB (11/2012, diverticulosis), myasthenia gravis, HTN, HLD, hypothyroidism, anemia of chronic disease, CKD (stage III) and possible COPD and restrictive lung disease who presents today for follow-up.   She was seen 11/2012 at [facility] by Dr. Kirke Corin for bradycardia (HR 30s) and syncope. Coreg held. She subsequently developed a-fib with RVR d/t lower GIB, treated successfully with amiodarone. Bleeding stopped and outpatient colonoscopy was recommended if patient could tolerate.   2D echo 11/23/2012: EF 60-65%, impaired LV diastolic filling, moderate AI, moderately elevated PASP.   She was admitted at [facility] briefly in 04/2013 for chest pain. She ruled out and [employer] revealed no evidence of ischemia or WMAs, EF 61%. Deemed to be low risk.  Of note, a significant goiter with marked TSH elevation was appreciated. She was started on Synthroid with plans for ongoing monitoring.   04/2013 Labwork: Lipid panel: TC 251, LDL 135, HDL 95, TG 103 Hgb A1C 5.9%  She was admitted again 10/13 to 06/02/13 for generalized leg weakness and LE edema. Bilateral knee effusions were appreciated and aspirated revealing monosodium urate crystals c/w gout. She received intra-articular steroid injections and IV Solu-Medrol. Norvasc was held for bilateral LE edema. Lasix was held due to renal insufficiency (2.6->2.0 on discharge)(prerenal d/t diuretics, poor PO intake). Compression stockings improved edema on admission. Renal u/s revealed findings c/w chronic kidney disease. Weakness was attributed to myasthenia progression which was  treated with steroids and pyridostigmine. She did have respiratory failure attributed to underlying COPD and restrictive lung dz from myasthenia gravis which improved with DuoNebs and O2. She was discharged to SNF short term.   She was diagnosed with LLL PNA per radiograph report 10/29. She had been experiencing yellow sputum. She has had ~ 11 lbs weight gain (baseline weight 122 lbs), LE edema and shortness of breath endorsed. Reviewing records, she has not been receiving schedule Lasix. As above, this was held due to renal function. She did receive a IM dose of Lasix today with good response per patient. No chest pain or palpitations. She continues to take prednisone daily for myasthenia gravis. She thinks she is consuming a lot of salt. She continues to wear compression stockings. She continues to wear supplemental O2 via n/c.  Labwork at SNF:   K 5.4 10/24, 5.1 10/27 BUN 26/Cr 1.5, GFR 42 10/24  EKG: NSR, 68 bpm, poor R wave progression, no ST/T changes  Wt Readings from Last 3 Encounters:  06/20/13 133 lb 6 oz (60.499 kg)  03/10/13 120 lb 12 oz (54.772 kg)  02/28/13 120 lb (54.432 kg)     Past Medical History  Diagnosis Date  . Asthma   . Chronic kidney disease     stage III, baseline Cr 1.5-1.6  . Myasthenia gravis   . Diverticulosis   . Hyperlipidemia   . CHF (congestive heart failure)   . Arthritis   . Paroxysmal atrial fibrillation   . Chronic diastolic heart failure     With moderate aortic valve insufficiency and moderate pulmonary hypertension  . Lower GI bleed     due to diverticulosis  .  Left foot drop 02/28/2013  . Hypertension   . Moderate aortic insufficiency   . Gout     Current Outpatient Prescriptions  Medication Sig Dispense Refill  . acetaminophen (MAPAP) 325 MG tablet Take 650 mg by mouth every 6 (six) hours as needed for pain.      Marland Kitchen amiodarone (PACERONE) 200 MG tablet Take 1 tablet (200 mg total) by mouth daily.  30 tablet  6  . amLODipine (NORVASC) 5  MG tablet Take 1 tablet (5 mg total) by mouth daily.  30 tablet  6  . docusate sodium (COLACE) 100 MG capsule Take 100 mg by mouth 2 (two) times daily.       . ferrous sulfate 325 (65 FE) MG tablet Take 325 mg by mouth 2 (two) times daily.      . furosemide (LASIX) 20 MG tablet Take 20 mg by mouth as needed.      . hydrALAZINE (APRESOLINE) 100 MG tablet Take 1 tablet (100 mg total) by mouth 2 (two) times daily.  60 tablet  6  . levalbuterol (XOPENEX HFA) 45 MCG/ACT inhaler Inhale 1-2 puffs into the lungs every 6 (six) hours as needed for wheezing.      . Multiple Vitamin (MULTIVITAMIN) tablet Take 1 tablet by mouth daily.      Marland Kitchen omeprazole (PRILOSEC) 20 MG capsule Take 20 mg by mouth daily.      . predniSONE (DELTASONE) 10 MG tablet Take 10 mg by mouth daily.      Marland Kitchen pyridostigmine (MESTINON) 60 MG tablet Take 0.5 tablets (30 mg total) by mouth 3 (three) times daily.  45 tablet  6  . traMADol (ULTRAM) 50 MG tablet Take 1 tablet (50 mg total) by mouth as needed.  40 tablet  1   No current facility-administered medications for this visit.    Allergies:   No Known Allergies  Social History:  The patient  reports that she has quit smoking. Her smoking use included Cigarettes. She has a 5 pack-year smoking history. She does not have any smokeless tobacco history on file. She reports that she does not drink alcohol or use illicit drugs.   Family History:  Family History  Problem Relation Age of Onset  . Heart disease Sister   . Hypertension Mother   . Hyperlipidemia Mother   . Hypertension Father   . Hyperlipidemia Father     Review of Systems: General: negative for chills, fever, night sweats or weight changes.  Cardiovascular: positive for SOB/DOE, LE edema, negative for chest pain, orthopnea, paroxysmal nocturnal dyspnea Dermatological: negative for rash Respiratory: positive for productive cough, negative for wheezing Urologic: negative for hematuria Abdominal: negative for nausea,  vomiting, diarrhea, bright red blood per rectum, melena, or hematemesis Neurologic: negative for visual changes, syncope, or dizziness All other systems reviewed and are otherwise negative except as noted above.  PHYSICAL EXAM: VS:  Ht 5\' 6"  (1.676 m)  Wt 133 lb 6 oz (60.499 kg)  BMI 21.54 kg/m2 Frail, elderly appearing female seated in a wheelchair in no acute distress HEENT: normal, PERRL Neck: JVP 7 cm, no bruits Cardiac:  normal S1, S2; II/VI diastolic murmur at LLSB, RRR; no gallops Lungs: diminished breath sounds, notably at L base, no wheezing, rhonchi or rales Abd: soft, nontender, no hepatomegaly, normoactive BS x 4 quads Ext: 2-3+ bilateral pretibial edema, no cyanosis or clubbing Skin: warm and dry, cap refill < 2 sec Neuro:  CNs 2-12 intact, no focal abnormalities noted Musculoskeletal: strength and tone  appropriate for age  Psych: normal affect

## 2013-06-20 NOTE — Telephone Encounter (Signed)
Spoke w/ pt's sister.  She states that pt is in [facility].  Pt recently was diagnosed with pneumonia and sister believes this has cleared up. Reports swelling in face, jaw and ankles. Pt SOB, recently put on O2, which is new for pt.  Reports that pt weighed 122 lbs at home, but weighed 137 yesterday.  Sister states that pt cannot travel by car.  She is unsure how she is taking meds. Instructed her that pt needs to be seen immediately and offered her appt to be seen in our office today. She reports that pt will have to travel via ambulance and she would prefer to take her to the ED. Sister to call if we can be of any assistance.

## 2013-06-20 NOTE — Assessment & Plan Note (Signed)
The patient has had 11 lbs weight gain, LE edema and SOB since discharge. Lasix held last admission d/t A/CKD. Cr improved to 1.5 near baseline on 10/24. She was recently diagnosed with LLL PNA and is maintained on chronic prednisone use which likely attribute to her decompensation/fluid overload. We discussed close management and follow-up from an outpatient standpoint. She is considerably weak and has elected for inpatient management. Her sister is with her today and will help arrange transport to [facility] ED.

## 2013-06-20 NOTE — Telephone Encounter (Signed)
Pt sister called and states pt is retaining fluid, wanted pt to be see today, please call, pt missed last appt

## 2013-06-20 NOTE — Assessment & Plan Note (Signed)
Noted on recent CXR performed 10/29 at [facility] facility. Will continue to monitor.

## 2013-06-20 NOTE — Assessment & Plan Note (Signed)
Well-controlled on today's visit. Continue current antihypertensives. 

## 2013-06-20 NOTE — Patient Instructions (Signed)
We will plan to see you in the hospital for management of your heart failure. Further recommendations will be provided at that time and near discharge.

## 2013-06-20 NOTE — Assessment & Plan Note (Signed)
Maintaining NSR on amiodarone 200mg  BID. Will need to down-titrate to 200mg  daily. Not a candidate for anticoagulation.

## 2013-06-20 NOTE — Assessment & Plan Note (Signed)
No h/o DM2. Possible glomerulosclerosis from longstanding HTN. Renal function back to baseline per 10/24 BMET.

## 2013-06-21 ENCOUNTER — Telehealth: Payer: Self-pay

## 2013-06-21 NOTE — Telephone Encounter (Signed)
Called [facility] and confirmed that they received fax this morning with orders.  Pt's wt today is 127 lbs. Pt's nurse was unavailable to give orders. She is to call back for [place] to give order : for wt < 120, hold lasix.

## 2013-06-21 NOTE — Telephone Encounter (Signed)
Spoke w/ Frederich Cha, RN at [facility] . She is agreeable to holding lasix if wt < 120lbs.

## 2013-06-21 NOTE — Telephone Encounter (Signed)
Message copied by Marilynne Halsted on Wed Jun 21, 2013  8:45 AM ------      Message from: Odella Aquas A      Created: Wed Jun 21, 2013  8:22 AM       Please call the patient's skilled nursing facility for the following orders:             - Start Lasix 40mg  PO daily beginning today (take on an empty stomach).      - Start potassium chloride (KDur) 20 mEq daily beginning today.      - Weigh every day and monitor urine output. Goal weight for diuresis is 122-125 lbs.       - Restrict salt (<2 g of sodium per day) and fluid (< 1.5 L of fluid per day).       - Continue compression stockings.       - Check basic metabolic panel 11/7.      - Continue treating recently diagnosed pneumonia.       - Decrease amiodarone to 200mg  PO daily.       - Call our office with weight trend on Friday 11/7 and further orders.            Thank you!            - Alinda Money ------

## 2013-06-21 NOTE — Telephone Encounter (Signed)
New orders faxed to [facility] at [phone].

## 2013-06-23 ENCOUNTER — Telehealth: Payer: Self-pay | Admitting: *Deleted

## 2013-06-23 NOTE — Telephone Encounter (Signed)
Spoke/ w/ caregiver.  States that pt has been discharged from [facility]. She was inquiring if our office had sent order for pt's intake & output to be recorded.  Our last order sent states for pt's wt and output to be monitored. She will call pt's PCP to inquire if they sent any orders over.

## 2013-06-23 NOTE — Telephone Encounter (Signed)
Care taker called and her weight is at 121.

## 2013-07-27 ENCOUNTER — Emergency Department: Payer: Self-pay | Admitting: Emergency Medicine

## 2013-07-27 LAB — URINALYSIS, COMPLETE
Bilirubin,UR: NEGATIVE
Blood: NEGATIVE
Glucose,UR: NEGATIVE mg/dL (ref 0–75)
Ketone: NEGATIVE
Leukocyte Esterase: NEGATIVE
Nitrite: NEGATIVE
Ph: 5 (ref 4.5–8.0)
Protein: 30
Squamous Epithelial: 12
WBC UR: 3 /HPF (ref 0–5)

## 2013-07-27 LAB — CBC WITH DIFFERENTIAL/PLATELET
Eosinophil #: 0 10*3/uL (ref 0.0–0.7)
HGB: 9.5 g/dL — ABNORMAL LOW (ref 12.0–16.0)
Lymphocyte #: 1.5 10*3/uL (ref 1.0–3.6)
MCH: 29.6 pg (ref 26.0–34.0)
MCHC: 32.6 g/dL (ref 32.0–36.0)
MCV: 91 fL (ref 80–100)
Monocyte %: 4.1 %
RBC: 3.21 10*6/uL — ABNORMAL LOW (ref 3.80–5.20)

## 2013-07-27 LAB — BASIC METABOLIC PANEL
BUN: 22 mg/dL — ABNORMAL HIGH (ref 7–18)
Creatinine: 1.76 mg/dL — ABNORMAL HIGH (ref 0.60–1.30)
EGFR (African American): 31 — ABNORMAL LOW
EGFR (Non-African Amer.): 27 — ABNORMAL LOW
Glucose: 110 mg/dL — ABNORMAL HIGH (ref 65–99)
Osmolality: 289 (ref 275–301)
Potassium: 5.4 mmol/L — ABNORMAL HIGH (ref 3.5–5.1)

## 2013-07-27 LAB — TROPONIN I: Troponin-I: <0.02 ng/mL

## 2013-07-28 ENCOUNTER — Telehealth: Payer: Self-pay | Admitting: Neurology

## 2013-07-28 NOTE — Telephone Encounter (Signed)
Patient's caregiver called to state that patient was in emergency room last night for myasphenia gravis and would like to have a sooner appointment than the one scheduled in February. Please call the patient.

## 2013-07-28 NOTE — Telephone Encounter (Signed)
I called the patient. The patient apparently went to the emergency room with shortness of breath. He was felt that this was related to her myasthenia gravis. The patient had issues with this for several months, gradually getting worse. I'll try to get her into the office for an evaluation.

## 2013-07-28 NOTE — Telephone Encounter (Signed)
Please advise 

## 2013-08-01 ENCOUNTER — Ambulatory Visit: Payer: Self-pay | Admitting: Neurology

## 2013-08-02 ENCOUNTER — Encounter: Payer: Self-pay | Admitting: Neurology

## 2013-08-02 ENCOUNTER — Ambulatory Visit (INDEPENDENT_AMBULATORY_CARE_PROVIDER_SITE_OTHER): Payer: Medicare Other | Admitting: Neurology

## 2013-08-02 VITALS — BP 177/72 | HR 64 | Wt 118.0 lb

## 2013-08-02 DIAGNOSIS — G7000 Myasthenia gravis without (acute) exacerbation: Secondary | ICD-10-CM

## 2013-08-02 MED ORDER — PYRIDOSTIGMINE BROMIDE 60 MG PO TABS: 30.0000 mg | ORAL_TABLET | Freq: Four times a day (QID) | ORAL | Status: DC

## 2013-08-02 NOTE — Patient Instructions (Signed)
Increase the mestinon to one half tablet four times a day.    Myasthenia Gravis Myasthenia gravis is a disease that causes muscle weakness throughout the body. The muscles affected are the ones we can control (voluntary muscles). An example of a voluntary muscle is your hand muscles. You can control the muscles to make the hand pick something up. An example of an involuntary muscle is the heart. The heart beats without any direction from you.  Myasthenia Gravis is thought to be an autoimmune disease. That means that normal defenses of the body begin to attack the body. In this case, the immune system begins to attack cells located at the junctions of the muscles and the nerves. Women are affected more often. Women are affected at a younger age than men. Babies born to affected women frequently develop symptoms at an early age. SYMPTOMS Initially in the disease, the facial muscles are affected first. After this, a person may develop droopy eyelids. They may have difficulty controlling facial muscles. They may have problems chewing. Swallowing and speaking may become impaired. The weakness gradually spreads to the arms and legs. It begins to affect breathing. Sometimes, the symptoms lessen or go away without any apparent cause. DIAGNOSIS  Diagnosis can be made with blood tests. Tests such as electromyography may be done to examine the electrical activity in the muscle. An improvement in symptoms after having an anti-cholinesterase drug helps confirm the diagnosis.  TREATMENT  Medicines are usually prescribed as the first treatment. These medicines help, but they do not cure the disease. A plasma cleansing procedure (plasmapheresis) can be used to treat a crisis. It can also be used to prepare a person for surgery. This procedure produces short-term improvement. Some cases are helped by removing the thymus gland. Steroids are used for short-term benefits. Document Released: 11/09/2000 Document Revised:  10/26/2011 Document Reviewed: 08/03/2005 [facility] Patient Information 2014 [place], [place].

## 2013-08-02 NOTE — Progress Notes (Signed)
Reason for visit: Myasthenia gravis  [name] Duffy is an 79 y.o. female  History of present illness:  [name] Duffy is a 79 year old right-handed black female with a history of myasthenia gravis primarily with ocular features. The patient has been on low-dose prednisone taking 10 mg daily, and taking Mestinon 60 mg, one half tablet 3 times daily. The prednisone dose was dropped in the summer of 2014 to 7.5 mg daily, but the patient had to increased back to the 10 mg tablet secondary to bilateral thigh discomfort. The patient has done well with the leg pain, but the patient has begun having some problems with her breathing. The patient went to the hospital at [facility] in October of 2014 with an attack of the gout. During that hospitalization, the patient began having some problems with shortness of breath. The patient has been on oxygen therapy since that time. The patient has a history of congestive heart failure, and she recently went into the hospital 2 weeks ago with what was felt to be congestive heart failure. Her workup apparently included a chest x-ray, EKG, and a 2-D echocardiogram according to the patient. The patient was told that her myasthenia gravis was the cause of her shortness of breath. The patient reports no double vision, but she does have some ptosis of the left eyelid at times. The patient drools at nighttime, not during the day. The patient occasionally will have some troubles with chewing, but she does not choke with swallowing. The patient is at home, getting physical therapy. The patient returns to the office today for an evaluation. The patient uses a walker for ambulation, no recent falls have been noted. The patient denies any numbness of the extremities.  Past Medical History  Diagnosis Date  . Asthma   . Chronic kidney disease     stage III, baseline Cr 1.5-1.6  . Myasthenia gravis   . Diverticulosis   . Hyperlipidemia   . CHF (congestive heart failure)   . Arthritis    . Paroxysmal atrial fibrillation   . Chronic diastolic heart failure     With moderate aortic valve insufficiency and moderate pulmonary hypertension  . Lower GI bleed     due to diverticulosis  . Left foot drop 02/28/2013  . Hypertension   . Moderate aortic insufficiency   . Gout   . Thyroid disease     Past Surgical History  Procedure Laterality Date  . Cataract extraction    . Colonoscopy      Family History  Problem Relation Age of Onset  . Heart disease Sister   . Hypertension Mother   . Hyperlipidemia Mother   . Hypertension Father   . Hyperlipidemia Father     Social history:  reports that she has quit smoking. Her smoking use included Cigarettes. She has a 5 pack-year smoking history. She does not have any smokeless tobacco history on file. She reports that she does not drink alcohol or use illicit drugs.   No Known Allergies  Medications:  Current Outpatient Prescriptions on File Prior to Visit  Medication Sig Dispense Refill  . acetaminophen (MAPAP) 325 MG tablet Take 650 mg by mouth every 6 (six) hours as needed for pain.      Marland Kitchen albuterol (PROVENTIL) (2.5 MG/3ML) 0.083% nebulizer solution Take 2.5 mg by nebulization every 4 (four) hours as needed for wheezing or shortness of breath.      Marland Kitchen amiodarone (PACERONE) 200 MG tablet Take 1 tablet (200 mg total) by  mouth daily.  30 tablet  6  . docusate sodium (COLACE) 100 MG capsule Take 100 mg by mouth 2 (two) times daily.       . ferrous sulfate 325 (65 FE) MG tablet Take 325 mg by mouth 2 (two) times daily.      Marland Kitchen guaiFENesin (ROBITUSSIN MUCUS+CHEST CONGEST) 100 MG/5ML liquid Take 20 mLs by mouth 4 (four) times daily as needed for cough.      . hydrALAZINE (APRESOLINE) 100 MG tablet Take 1 tablet (100 mg total) by mouth 2 (two) times daily.  60 tablet  6  . Lactobacillus (ACIDOPHILUS PO) Take by mouth. Prn      . levothyroxine (SYNTHROID, LEVOTHROID) 25 MCG tablet Take 25 mcg by mouth daily before breakfast.       .  omeprazole (PRILOSEC) 20 MG capsule Take 20 mg by mouth daily.      . predniSONE (DELTASONE) 10 MG tablet Take 10 mg by mouth daily.      . traMADol (ULTRAM) 50 MG tablet Take 1 tablet (50 mg total) by mouth as needed.  40 tablet  1   No current facility-administered medications on file prior to visit.    ROS:  Out of a complete 14 system review of symptoms, the patient complains only of the following symptoms, and all other reviewed systems are negative.  Appetite change, excessive sweating Ringing in the ears, runny nose, drooling, trouble swallowing Eye itching Snoring Abdominal pain Joint pain, joint swelling Shortness of breath  Blood pressure 177/72, pulse 64, weight 118 lb (53.524 kg).  Physical Exam  General: The patient is alert and cooperative at the time of the examination.  Skin: 1-2+ edema below the knees is noted bilaterally.   Neurologic Exam  Mental status: The patient is oriented x 3.  Cranial nerves: Facial symmetry is not present. The patient has ptosis of the left eye. Speech is normal, no aphasia or dysarthria is noted. Extraocular movements are full. Visual fields are full. With superior gaze for 1 minute, no reported double vision is seen, no divergence of gaze is seen. The patient may have slight increase in the ptosis of the left eye. The patient appears to have good strength of the facial muscles and the jaw muscles bilaterally.  Motor: The patient has good strength in all 4 extremities, with exception of 4/5 strength proximally in the legs bilaterally. The patient has no fatigable weakness of the deltoid muscles with the arms outstretched 1 minute.  Sensory examination: Soft touch sensation on the face, and arms is symmetric.  Coordination: The patient has good finger-nose-finger and heel-to-shin bilaterally.  Gait and station: The patient has an unsteady gait, the patient walks with a walker. Tandem gait was not attempted. Romberg is negative. No  drift is seen.  Reflexes: Deep tendon reflexes are symmetric, but are depressed.   Assessment/Plan:  1. Myasthenia gravis  2. Congestive heart failure  3. Gait disorder  The patient has a lot of underlying medical issues. The patient does have myasthenia gravis, but in general it has been mainly involving the eyes without respiratory or peripheral muscular weakness. The patient will be increased on the Mestinon taking 30 mg 4 times daily. We may consider increasing the prednisone in the future. The patient will followup in 3-4 months.  Marlan Palau MD 08/02/2013 8:03 PM  [facility] [address] [place], [place] [address]  Phone [phone] Fax [phone]

## 2013-08-04 ENCOUNTER — Ambulatory Visit: Payer: Self-pay | Admitting: Neurology

## 2013-08-21 ENCOUNTER — Ambulatory Visit (INDEPENDENT_AMBULATORY_CARE_PROVIDER_SITE_OTHER): Payer: Medicare Other | Admitting: Cardiovascular Disease

## 2013-08-21 ENCOUNTER — Encounter: Payer: Self-pay | Admitting: Cardiovascular Disease

## 2013-08-21 VITALS — BP 183/85 | HR 56 | Ht 66.0 in | Wt 122.0 lb

## 2013-08-21 DIAGNOSIS — R0789 Other chest pain: Secondary | ICD-10-CM

## 2013-08-21 DIAGNOSIS — I480 Paroxysmal atrial fibrillation: Secondary | ICD-10-CM

## 2013-08-21 DIAGNOSIS — I4891 Unspecified atrial fibrillation: Secondary | ICD-10-CM

## 2013-08-21 DIAGNOSIS — I5032 Chronic diastolic (congestive) heart failure: Secondary | ICD-10-CM

## 2013-08-21 DIAGNOSIS — K219 Gastro-esophageal reflux disease without esophagitis: Secondary | ICD-10-CM

## 2013-08-21 DIAGNOSIS — I10 Essential (primary) hypertension: Secondary | ICD-10-CM

## 2013-08-21 MED ORDER — OMEPRAZOLE 20 MG PO CPDR: 20.0000 mg | DELAYED_RELEASE_CAPSULE | Freq: Two times a day (BID) | ORAL | Status: DC

## 2013-08-21 NOTE — Patient Instructions (Signed)
Your physician recommends that you schedule a follow-up appointment in: 3 months    Your physician has recommended you make the following change in your medication:  Omeprazole 20 mg twice a day

## 2013-08-25 DIAGNOSIS — K219 Gastro-esophageal reflux disease without esophagitis: Secondary | ICD-10-CM | POA: Insufficient documentation

## 2013-08-25 NOTE — Assessment & Plan Note (Signed)
She appears to be euvolemic on current dose of furosemide. 

## 2013-08-25 NOTE — Assessment & Plan Note (Signed)
She is maintaining in sinus rhythm on amiodarone 200 mg once daily. This will be continued at the current dose. I will check liver and thyroid profile in the next 6 months. She is determined to be not a candidate for anticoagulation due to previous  GI bleed. This might need to be considered in the future as the risk /benefit ratio changes.

## 2013-08-25 NOTE — Progress Notes (Signed)
HPI  This is a 80 year old female who is here today for a followup visit regarding paroxysmal atrial fibrillation and hypertension. She presented to [facility] on April 4 with lower GI bleed felt to be due to diverticular bleed. During her hospitalization, she developed severe bradycardia with a syncopal episode. Her heart rate was noted to be in the 30s. She was on carvedilol 12.5 mg twice daily which was discontinued. Subsequently, she developed atrial fibrillation with rapid ventricular response. She was started on amiodarone with restoration of normal sinus rhythm. An echocardiogram was done which showed an ejection fraction of 60-65%, moderate aortic insufficiency and moderate pulmonary hypertension. She did have fluid overload which was treated with Lasix . She had an acute on chronic renal failure which subsequently improved. She She has been doing reasonably well.  She was seen by Alinda Moneyony in November. The dose of amiodarone was decreased to 200 mg once daily. She has been doing reasonably well. She complains of increased heartburn symptoms. Blood pressure at home has been running within acceptable range. She did not take it hydralazine today.   No Known Allergies   Current Outpatient Prescriptions on File Prior to Visit  Medication Sig Dispense Refill  . acetaminophen (MAPAP) 325 MG tablet Take 650 mg by mouth every 6 (six) hours as needed for pain.      Marland Kitchen. amiodarone (PACERONE) 200 MG tablet Take 1 tablet (200 mg total) by mouth daily.  30 tablet  6  . docusate sodium (COLACE) 100 MG capsule Take 100 mg by mouth daily.       . ferrous sulfate 325 (65 FE) MG tablet Take 325 mg by mouth daily with breakfast.       . furosemide (LASIX) 40 MG tablet Take 40 mg by mouth daily as needed.      . hydrALAZINE (APRESOLINE) 100 MG tablet Take 1 tablet (100 mg total) by mouth 2 (two) times daily.  60 tablet  6  . levothyroxine (SYNTHROID, LEVOTHROID) 25 MCG tablet Take 25 mcg by mouth daily before  breakfast.       . predniSONE (DELTASONE) 10 MG tablet Take 10 mg by mouth daily.      Marland Kitchen. pyridostigmine (MESTINON) 60 MG tablet Take 0.5 tablets (30 mg total) by mouth 4 (four) times daily.  60 tablet  5   No current facility-administered medications on file prior to visit.     Past Medical History  Diagnosis Date  . Asthma   . Chronic kidney disease     stage III, baseline Cr 1.5-1.6  . Myasthenia gravis   . Diverticulosis   . Hyperlipidemia   . CHF (congestive heart failure)   . Arthritis   . Paroxysmal atrial fibrillation   . Chronic diastolic heart failure     With moderate aortic valve insufficiency and moderate pulmonary hypertension  . Lower GI bleed     due to diverticulosis  . Left foot drop 02/28/2013  . Hypertension   . Moderate aortic insufficiency   . Gout   . Thyroid disease      Past Surgical History  Procedure Laterality Date  . Cataract extraction    . Colonoscopy       Family History  Problem Relation Age of Onset  . Heart disease Sister   . Hypertension Mother   . Hyperlipidemia Mother   . Hypertension Father   . Hyperlipidemia Father      History   Social History  . Marital Status: Single  Spouse Name: N/A    Number of Children: N/A  . Years of Education: N/A   Occupational History  . Not on file.   Social History Main Topics  . Smoking status: Former Smoker -- 0.50 packs/day for 10 years    Types: Cigarettes  . Smokeless tobacco: Not on file  . Alcohol Use: No  . Drug Use: No  . Sexual Activity: Not on file   Other Topics Concern  . Not on file   Social History Narrative  . No narrative on file     PHYSICAL EXAM   BP 183/85  Pulse 56  Ht 5\' 6"  (1.676 m)  Wt 122 lb (55.339 kg)  BMI 19.70 kg/m2 Constitutional: She is oriented to person, place, and time. She appears well-developed and well-nourished. No distress.  HENT: No nasal discharge.  Head: Normocephalic and atraumatic.  Eyes: Pupils are equal and round.  Right eye exhibits no discharge. Left eye exhibits no discharge.  Neck: Normal range of motion. Neck supple. No JVD present. No thyromegaly present.  Cardiovascular: Normal rate, regular rhythm, normal heart sounds. Exam reveals no gallop and no friction rub. There is a 2/6 systolic ejection murmur at the aortic area which is early peaking. Pulmonary/Chest: Effort normal and breath sounds normal. No stridor. No respiratory distress. She has no wheezes. She has no rales. She exhibits no tenderness.  Abdominal: Soft. Bowel sounds are normal. She exhibits no distension. There is no tenderness. There is no rebound and no guarding.  Musculoskeletal: Normal range of motion. She exhibits no edema and no tenderness.  Neurological: She is alert and oriented to person, place, and time. Coordination normal.  Skin: Skin is warm and dry. No rash noted. She is not diaphoretic. No erythema. No pallor.  Psychiatric: She has a normal mood and affect. Her behavior is normal. Judgment and thought content normal.     EKG: Sinus bradycardia with a heart rate of 58 beats per minute.  ASSESSMENT AND PLAN

## 2013-08-25 NOTE — Assessment & Plan Note (Signed)
Blood pressure is elevated. She did not take hydralazine today. Home blood pressure readings are within acceptable range.

## 2013-08-25 NOTE — Assessment & Plan Note (Signed)
She is having increased symptoms of GERD. Thus, I recommend increasing the dose of omeprazole to twice daily.

## 2013-08-28 ENCOUNTER — Telehealth: Payer: Self-pay

## 2013-08-28 NOTE — Telephone Encounter (Signed)
Request from [employer]  , sent to [employer] on 08/29/2013 .

## 2013-08-31 ENCOUNTER — Telehealth: Payer: Self-pay

## 2013-08-31 DIAGNOSIS — R0789 Other chest pain: Secondary | ICD-10-CM

## 2013-08-31 NOTE — Telephone Encounter (Signed)
Patient states ever since she started taking Prilosec twice a day her heartburn has gotten worse. She says she can feel her heartburn in her chest and back. She wants to go back to once a day or try something else for heartburn.

## 2013-08-31 NOTE — Telephone Encounter (Signed)
Pt states Dr Kirke CorinArida gave her medication for acid reflux and states "it is not agreeing with her" states "i am burning right now in my back". Please call.

## 2013-09-04 NOTE — Telephone Encounter (Signed)
That does not make sense. We should make sure that the heartburn is not related to a heart condition. I recommend a Lexiscan myoview stress test.

## 2013-09-05 NOTE — Telephone Encounter (Signed)
Called patient to set up lexiscan. Patient verbalized understanding.   [facility] MYOVIEW  Your caregiver has ordered a Stress Test with nuclear imaging. The purpose of this test is to evaluate the blood supply to your heart muscle. This procedure is referred to as a "Non-Invasive Stress Test." This is because other than having an IV started in your vein, nothing is inserted or "invades" your body. Cardiac stress tests are done to find areas of poor blood flow to the heart by determining the extent of coronary artery disease (CAD). Some patients exercise on a treadmill, which naturally increases the blood flow to your heart, while others who are  unable to walk on a treadmill due to physical limitations have a pharmacologic/chemical stress agent called Lexiscan . This medicine will mimic walking on a treadmill by temporarily increasing your coronary blood flow.   Please note: these test may take anywhere between 2-4 hours to complete  PLEASE REPORT TO [facility] ENTR[facility]  THE VOLUNTEERS AT THE FIRST DESK WILL DIRECT YOU WHERE TO GO  Date of Procedure:__________________1/26/15___________________  Arrival Time for Procedure:______________0945________________   PLEASE NOTIFY THE OFFICE AT LEAST 24 HOURS IN ADVANCE IF YOU ARE UNABLE TO KEEP YOUR APPOINTMENT.  [phone] [id number] AND  PLEASE NOTIFY NUC[facility]AR MEDICINE AT [facility] AT LEAST 24 HOURS IN ADVANCE IF YOU ARE UNABLE TO KEEP YOUR APPOINTMENT. [phone]  How to prepare for your Myoview test:  1. Do not eat or drink after midnight 2. No caffeine for 24 hours prior to test 3. No smoking 24 hours prior to test. 4. Your medication may be taken with water.  If your doctor stopped a medication because of this test, do not take that medication. 5. Ladies, please do not wear dresses.  Skirts or pants are appropriate. Please wear a short sleeve shirt. 6. No perfume, cologne or lotion. 7. Wear comfortable walking shoes. No heels!

## 2013-09-11 ENCOUNTER — Ambulatory Visit: Payer: Self-pay | Admitting: Cardiovascular Disease

## 2013-09-11 DIAGNOSIS — R079 Chest pain, unspecified: Secondary | ICD-10-CM

## 2013-09-12 ENCOUNTER — Other Ambulatory Visit: Payer: Self-pay

## 2013-09-12 DIAGNOSIS — R0789 Other chest pain: Secondary | ICD-10-CM

## 2013-09-18 ENCOUNTER — Ambulatory Visit: Payer: Medicare Other | Admitting: Neurology

## 2013-09-21 ENCOUNTER — Other Ambulatory Visit: Payer: Self-pay | Admitting: *Deleted

## 2013-09-21 MED ORDER — HYDRALAZINE HCL 100 MG PO TABS: 100.0000 mg | ORAL_TABLET | Freq: Two times a day (BID) | ORAL | Status: AC

## 2013-09-21 MED ORDER — OMEPRAZOLE 20 MG PO CPDR: 20.0000 mg | DELAYED_RELEASE_CAPSULE | Freq: Two times a day (BID) | ORAL | Status: AC

## 2013-09-21 NOTE — Telephone Encounter (Signed)
Requested Prescriptions   Signed Prescriptions Disp Refills  . hydrALAZINE (APRESOLINE) 100 MG tablet 60 tablet 6    Sig: Take 1 tablet (100 mg total) by mouth 2 (two) times daily.    Authorizing Provider: Antonieta IbaGOLLAN, TIMOTHY J    Ordering User: Kendrick FriesLOPEZ, Elius Etheredge C  . omeprazole (PRILOSEC) 20 MG capsule 60 capsule 6    Sig: Take 1 capsule (20 mg total) by mouth 2 (two) times daily before a meal.    Authorizing Provider: Antonieta IbaGOLLAN, TIMOTHY J    Ordering User: Kendrick FriesLOPEZ, Melrose Kearse C

## 2013-09-26 ENCOUNTER — Telehealth: Payer: Self-pay

## 2013-09-26 NOTE — Telephone Encounter (Signed)
Nurse with [facility] called and wanted to let us know pt has stopped taking her Amiodarone, states pt looked up side effects and does not want to take it anymore.  She states Dr. Marvis MoellerMiles tried to talk with pt and she would not listen. Requests we try to call pt and talk with her.

## 2013-09-26 NOTE — Telephone Encounter (Signed)
Discussed medication with patient and she decided to take Amiodarone as ordered. She stated that reading the side effects made her anxious for a minute but then after she thought about it she realized she has been on it a while with no issue.

## 2013-10-04 ENCOUNTER — Emergency Department: Payer: Self-pay | Admitting: Emergency Medicine

## 2013-10-04 LAB — BASIC METABOLIC PANEL
Anion Gap: 3 — ABNORMAL LOW (ref 7–16)
BUN: 32 mg/dL — AB (ref 7–18)
CO2: 31 mmol/L (ref 21–32)
CREATININE: 1.73 mg/dL — AB (ref 0.60–1.30)
Calcium, Total: 9.2 mg/dL (ref 8.5–10.1)
Chloride: 108 mmol/L — ABNORMAL HIGH (ref 98–107)
EGFR (Non-African Amer.): 27 — ABNORMAL LOW
GFR CALC AF AMER: 32 — AB
Glucose: 88 mg/dL (ref 65–99)
OSMOLALITY: 289 (ref 275–301)
POTASSIUM: 5.4 mmol/L — AB (ref 3.5–5.1)
SODIUM: 142 mmol/L (ref 136–145)

## 2013-10-04 LAB — CBC
HCT: 30.5 % — ABNORMAL LOW (ref 35.0–47.0)
HGB: 9.9 g/dL — ABNORMAL LOW (ref 12.0–16.0)
MCH: 30.1 pg (ref 26.0–34.0)
MCHC: 32.5 g/dL (ref 32.0–36.0)
MCV: 93 fL (ref 80–100)
PLATELETS: 187 10*3/uL (ref 150–440)
RBC: 3.29 10*6/uL — AB (ref 3.80–5.20)
RDW: 15 % — ABNORMAL HIGH (ref 11.5–14.5)
WBC: 9.4 10*3/uL (ref 3.6–11.0)

## 2013-10-04 LAB — TROPONIN I: Troponin-I: <0.02 ng/mL

## 2013-10-04 LAB — PRO B NATRIURETIC PEPTIDE: B-TYPE NATIURETIC PEPTID: 678 pg/mL — AB (ref 0–450)

## 2013-10-30 ENCOUNTER — Inpatient Hospital Stay: Payer: Self-pay | Admitting: Internal Medicine

## 2013-10-30 LAB — CBC WITH DIFFERENTIAL/PLATELET
BASOS PCT: 0.8 %
Basophil #: 0.1 10*3/uL (ref 0.0–0.1)
Eosinophil #: 0.1 10*3/uL (ref 0.0–0.7)
Eosinophil %: 0.8 %
HCT: 26.6 % — ABNORMAL LOW (ref 35.0–47.0)
HGB: 8.8 g/dL — AB (ref 12.0–16.0)
Lymphocyte #: 2.3 10*3/uL (ref 1.0–3.6)
Lymphocyte %: 28.4 %
MCH: 30.3 pg (ref 26.0–34.0)
MCHC: 33 g/dL (ref 32.0–36.0)
MCV: 92 fL (ref 80–100)
MONO ABS: 0.8 x10 3/mm (ref 0.2–0.9)
Monocyte %: 10.1 %
Neutrophil #: 4.8 10*3/uL (ref 1.4–6.5)
Neutrophil %: 59.9 %
PLATELETS: 201 10*3/uL (ref 150–440)
RBC: 2.9 10*6/uL — ABNORMAL LOW (ref 3.80–5.20)
RDW: 15 % — ABNORMAL HIGH (ref 11.5–14.5)
WBC: 7.9 10*3/uL (ref 3.6–11.0)

## 2013-10-30 LAB — COMPREHENSIVE METABOLIC PANEL
ALBUMIN: 3.2 g/dL — AB (ref 3.4–5.0)
ALK PHOS: 49 U/L
Anion Gap: 2 — ABNORMAL LOW (ref 7–16)
BUN: 32 mg/dL — ABNORMAL HIGH (ref 7–18)
Bilirubin,Total: 0.2 mg/dL (ref 0.2–1.0)
CO2: 30 mmol/L (ref 21–32)
Calcium, Total: 8.7 mg/dL (ref 8.5–10.1)
Chloride: 109 mmol/L — ABNORMAL HIGH (ref 98–107)
Creatinine: 1.72 mg/dL — ABNORMAL HIGH (ref 0.60–1.30)
EGFR (African American): 32 — ABNORMAL LOW
EGFR (Non-African Amer.): 28 — ABNORMAL LOW
Glucose: 86 mg/dL (ref 65–99)
Osmolality: 287 (ref 275–301)
Potassium: 4.3 mmol/L (ref 3.5–5.1)
SGOT(AST): 18 U/L (ref 15–37)
SGPT (ALT): 14 U/L (ref 12–78)
SODIUM: 141 mmol/L (ref 136–145)
Total Protein: 6.5 g/dL (ref 6.4–8.2)

## 2013-10-30 LAB — HEMOGLOBIN
HGB: 7.9 g/dL — AB (ref 12.0–16.0)
HGB: 7.8 g/dL — AB (ref 12.0–16.0)

## 2013-10-30 LAB — TROPONIN I: Troponin-I: <0.02 ng/mL

## 2013-10-30 LAB — URINALYSIS, COMPLETE
BACTERIA: NONE SEEN
BLOOD: NEGATIVE
Bilirubin,UR: NEGATIVE
Glucose,UR: NEGATIVE mg/dL (ref 0–75)
KETONE: NEGATIVE
Leukocyte Esterase: NEGATIVE
NITRITE: NEGATIVE
PH: 5 (ref 4.5–8.0)
PROTEIN: NEGATIVE
RBC,UR: 1 /HPF (ref 0–5)
SPECIFIC GRAVITY: 1.012 (ref 1.003–1.030)
Squamous Epithelial: NONE SEEN
WBC UR: 1 /HPF (ref 0–5)

## 2013-10-31 LAB — CBC WITH DIFFERENTIAL/PLATELET
Basophil #: 0.1 10*3/uL (ref 0.0–0.1)
Basophil %: 1 %
EOS ABS: 0.1 10*3/uL (ref 0.0–0.7)
EOS PCT: 0.7 %
HCT: 24.9 % — AB (ref 35.0–47.0)
HGB: 8.3 g/dL — ABNORMAL LOW (ref 12.0–16.0)
Lymphocyte #: 2.5 10*3/uL (ref 1.0–3.6)
Lymphocyte %: 32.5 %
MCH: 30.9 pg (ref 26.0–34.0)
MCHC: 33.3 g/dL (ref 32.0–36.0)
MCV: 93 fL (ref 80–100)
Monocyte #: 0.7 x10 3/mm (ref 0.2–0.9)
Monocyte %: 9.6 %
Neutrophil #: 4.3 10*3/uL (ref 1.4–6.5)
Neutrophil %: 56.2 %
Platelet: 182 10*3/uL (ref 150–440)
RBC: 2.69 10*6/uL — ABNORMAL LOW (ref 3.80–5.20)
RDW: 14.8 % — ABNORMAL HIGH (ref 11.5–14.5)
WBC: 7.6 10*3/uL (ref 3.6–11.0)

## 2013-10-31 LAB — BASIC METABOLIC PANEL
Anion Gap: 2 — ABNORMAL LOW (ref 7–16)
BUN: 26 mg/dL — ABNORMAL HIGH (ref 7–18)
CREATININE: 1.63 mg/dL — AB (ref 0.60–1.30)
Calcium, Total: 8.8 mg/dL (ref 8.5–10.1)
Chloride: 112 mmol/L — ABNORMAL HIGH (ref 98–107)
Co2: 31 mmol/L (ref 21–32)
EGFR (African American): 34 — ABNORMAL LOW
EGFR (Non-African Amer.): 29 — ABNORMAL LOW
GLUCOSE: 74 mg/dL (ref 65–99)
Osmolality: 292 (ref 275–301)
POTASSIUM: 4.5 mmol/L (ref 3.5–5.1)
SODIUM: 145 mmol/L (ref 136–145)

## 2013-10-31 LAB — MAGNESIUM: Magnesium: 2.2 mg/dL

## 2013-11-02 ENCOUNTER — Emergency Department: Payer: Self-pay | Admitting: Internal Medicine

## 2013-11-02 LAB — COMPREHENSIVE METABOLIC PANEL
ALBUMIN: 3.3 g/dL — AB (ref 3.4–5.0)
Alkaline Phosphatase: 56 U/L
Anion Gap: 1 — ABNORMAL LOW (ref 7–16)
BUN: 21 mg/dL — ABNORMAL HIGH (ref 7–18)
Bilirubin,Total: 0.3 mg/dL (ref 0.2–1.0)
CALCIUM: 8.8 mg/dL (ref 8.5–10.1)
CO2: 31 mmol/L (ref 21–32)
Chloride: 108 mmol/L — ABNORMAL HIGH (ref 98–107)
Creatinine: 1.7 mg/dL — ABNORMAL HIGH (ref 0.60–1.30)
EGFR (Non-African Amer.): 28 — ABNORMAL LOW
GFR CALC AF AMER: 32 — AB
GLUCOSE: 108 mg/dL — AB (ref 65–99)
OSMOLALITY: 283 (ref 275–301)
Potassium: 4.4 mmol/L (ref 3.5–5.1)
SGOT(AST): 22 U/L (ref 15–37)
SGPT (ALT): 17 U/L (ref 12–78)
SODIUM: 140 mmol/L (ref 136–145)
Total Protein: 6.7 g/dL (ref 6.4–8.2)

## 2013-11-02 LAB — CBC WITH DIFFERENTIAL/PLATELET
BASOS ABS: 0 10*3/uL (ref 0.0–0.1)
BASOS PCT: 0.6 %
Eosinophil #: 0 10*3/uL (ref 0.0–0.7)
Eosinophil %: 0 %
HCT: 26.6 % — ABNORMAL LOW (ref 35.0–47.0)
HGB: 8.8 g/dL — ABNORMAL LOW (ref 12.0–16.0)
Lymphocyte #: 1.3 10*3/uL (ref 1.0–3.6)
Lymphocyte %: 17.3 %
MCH: 30.7 pg (ref 26.0–34.0)
MCHC: 33.2 g/dL (ref 32.0–36.0)
MCV: 93 fL (ref 80–100)
MONO ABS: 0.4 x10 3/mm (ref 0.2–0.9)
Monocyte %: 4.8 %
NEUTROS ABS: 5.8 10*3/uL (ref 1.4–6.5)
Neutrophil %: 77.3 %
Platelet: 195 10*3/uL (ref 150–440)
RBC: 2.87 10*6/uL — ABNORMAL LOW (ref 3.80–5.20)
RDW: 14.8 % — ABNORMAL HIGH (ref 11.5–14.5)
WBC: 7.5 10*3/uL (ref 3.6–11.0)

## 2013-11-02 LAB — TROPONIN I: TROPONIN-I: 0.04 ng/mL

## 2013-11-02 LAB — LIPASE, BLOOD: LIPASE: 243 U/L (ref 73–393)

## 2013-11-02 LAB — PRO B NATRIURETIC PEPTIDE: B-Type Natriuretic Peptide: 881 pg/mL — ABNORMAL HIGH

## 2013-11-13 ENCOUNTER — Emergency Department: Payer: Self-pay | Admitting: Emergency Medicine

## 2013-11-13 LAB — COMPREHENSIVE METABOLIC PANEL
ANION GAP: 1 — AB (ref 7–16)
AST: 15 U/L (ref 15–37)
Albumin: 3.1 g/dL — ABNORMAL LOW (ref 3.4–5.0)
Alkaline Phosphatase: 47 U/L
BUN: 20 mg/dL — ABNORMAL HIGH (ref 7–18)
Bilirubin,Total: 0.3 mg/dL (ref 0.2–1.0)
CALCIUM: 8.6 mg/dL (ref 8.5–10.1)
CHLORIDE: 107 mmol/L (ref 98–107)
CO2: 31 mmol/L (ref 21–32)
Creatinine: 1.88 mg/dL — ABNORMAL HIGH (ref 0.60–1.30)
EGFR (African American): 29 — ABNORMAL LOW
GFR CALC NON AF AMER: 25 — AB
GLUCOSE: 94 mg/dL (ref 65–99)
OSMOLALITY: 280 (ref 275–301)
Potassium: 3.8 mmol/L (ref 3.5–5.1)
SGPT (ALT): 16 U/L (ref 12–78)
Sodium: 139 mmol/L (ref 136–145)
Total Protein: 6.3 g/dL — ABNORMAL LOW (ref 6.4–8.2)

## 2013-11-13 LAB — URINALYSIS, COMPLETE
Bilirubin,UR: NEGATIVE
Blood: NEGATIVE
Glucose,UR: NEGATIVE mg/dL (ref 0–75)
Hyaline Cast: 2
KETONE: NEGATIVE
Leukocyte Esterase: NEGATIVE
NITRITE: NEGATIVE
PH: 5 (ref 4.5–8.0)
PROTEIN: NEGATIVE
Specific Gravity: 1.013 (ref 1.003–1.030)
WBC UR: 1 /HPF (ref 0–5)

## 2013-11-13 LAB — CBC WITH DIFFERENTIAL/PLATELET
Basophil #: 0 10*3/uL (ref 0.0–0.1)
Basophil %: 0.6 %
Eosinophil #: 0 10*3/uL (ref 0.0–0.7)
Eosinophil %: 0.3 %
HCT: 27.3 % — AB (ref 35.0–47.0)
HGB: 9 g/dL — ABNORMAL LOW (ref 12.0–16.0)
LYMPHS ABS: 1 10*3/uL (ref 1.0–3.6)
LYMPHS PCT: 15 %
MCH: 30.5 pg (ref 26.0–34.0)
MCHC: 32.9 g/dL (ref 32.0–36.0)
MCV: 93 fL (ref 80–100)
MONOS PCT: 4.7 %
Monocyte #: 0.3 x10 3/mm (ref 0.2–0.9)
NEUTROS PCT: 79.4 %
Neutrophil #: 5.3 10*3/uL (ref 1.4–6.5)
Platelet: 206 10*3/uL (ref 150–440)
RBC: 2.95 10*6/uL — AB (ref 3.80–5.20)
RDW: 14.9 % — ABNORMAL HIGH (ref 11.5–14.5)
WBC: 6.6 10*3/uL (ref 3.6–11.0)

## 2013-11-13 LAB — D-DIMER(ARMC): D-DIMER: 2098 ng/mL

## 2013-11-13 LAB — PRO B NATRIURETIC PEPTIDE: B-Type Natriuretic Peptide: 590 pg/mL — ABNORMAL HIGH (ref 0–450)

## 2013-11-20 ENCOUNTER — Encounter: Payer: Self-pay | Admitting: Cardiovascular Disease

## 2013-11-20 ENCOUNTER — Ambulatory Visit (INDEPENDENT_AMBULATORY_CARE_PROVIDER_SITE_OTHER): Payer: Medicare Other | Admitting: Cardiovascular Disease

## 2013-11-20 VITALS — BP 176/74 | HR 58 | Ht 66.0 in | Wt 114.0 lb

## 2013-11-20 DIAGNOSIS — I4891 Unspecified atrial fibrillation: Secondary | ICD-10-CM

## 2013-11-20 DIAGNOSIS — I5032 Chronic diastolic (congestive) heart failure: Secondary | ICD-10-CM

## 2013-11-20 DIAGNOSIS — I480 Paroxysmal atrial fibrillation: Secondary | ICD-10-CM

## 2013-11-20 DIAGNOSIS — I5033 Acute on chronic diastolic (congestive) heart failure: Secondary | ICD-10-CM

## 2013-11-20 DIAGNOSIS — I10 Essential (primary) hypertension: Secondary | ICD-10-CM

## 2013-11-20 DIAGNOSIS — I509 Heart failure, unspecified: Secondary | ICD-10-CM

## 2013-11-20 MED ORDER — AMLODIPINE BESYLATE 5 MG PO TABS: 5.0000 mg | ORAL_TABLET | Freq: Every day | ORAL | Status: DC

## 2013-11-20 NOTE — Assessment & Plan Note (Signed)
She appears to be euvolemic. 

## 2013-11-20 NOTE — Patient Instructions (Signed)
Labs today.   Start Amlodipine 5 mg once daily for high blood pressure.  Continue other medications.   Your physician wants you to follow-up in: 6 months.  You will receive a reminder letter in the mail two months in advance. If you don't receive a letter, please call our office to schedule the follow-up appointment.

## 2013-11-20 NOTE — Assessment & Plan Note (Signed)
Blood pressure continues to be elevated. I added amlodipine 5 mg once daily. 

## 2013-11-20 NOTE — Assessment & Plan Note (Signed)
She is maintaining a normal sinus rhythm on amiodarone 200 mg once daily. Ideally, she should be on long-term anticoagulation. However, she had GI bleed last year. Also atrial fibrillation was in the setting of acute illness and beta blocker withdrawal.  If she develops any future episodes of atrial fibrillation, then anticoagulation should be considered. For now, I think that the risks outweigh the benefits. Check thyroid and liver profile today given that she is on amiodarone.

## 2013-11-20 NOTE — Progress Notes (Signed)
HPI  This is an 80 year old female who is here today for a followup visit regarding paroxysmal atrial fibrillation and hypertension. [facility] with lower GI bleed felt to be due to diverticular bleed. During her hospitalization, she developed severe bradycardia with a syncopal episode. Her heart rate was noted to be in the 30s. She was on carvedilol 12.5 mg twice daily which was discontinued. Subsequently, she developed atrial fibrillation with rapid ventricular response. She was started on amiodarone with restoration of normal sinus rhythm. An echocardiogram was done which showed an ejection fraction of 60-65%, moderate aortic insufficiency and moderate pulmonary hypertension. She did have fluid overload which was treated with Lasix . She had an acute on chronic renal failure which subsequently improved.  She underwent a pharmacologic nuclear stress test in January of 2015 due to symptoms of heartburn. This test showed no evidence of ischemia with normal ejection fraction. She was [facility] for bronch[facility]. She is feeling better overall. She reports resolution of heartburn.   No Known Allergies   Current Outpatient Prescriptions on File Prior to Visit  Medication Sig Dispense Refill  . acetaminophen (MAPAP) 325 MG tablet Take 650 mg by mouth every 6 (six) hours as needed for pain.      Marland Kitchen. amiodarone (PACERONE) 200 MG tablet Take 1 tablet (200 mg total) by mouth daily.  30 tablet  6  . docusate sodium (COLACE) 100 MG capsule Take 100 mg by mouth daily.       . ferrous sulfate 325 (65 FE) MG tablet Take 325 mg by mouth daily with breakfast.       . furosemide (LASIX) 40 MG tablet Take 40 mg by mouth daily as needed.      . hydrALAZINE (APRESOLINE) 100 MG tablet Take 1 tablet (100 mg total) by mouth 2 (two) times daily.  60 tablet  6  . levothyroxine (SYNTHROID, LEVOTHROID) 25 MCG tablet Take 25 mcg by mouth daily before breakfast.       .  omeprazole (PRILOSEC) 20 MG capsule Take 1 capsule (20 mg total) by mouth 2 (two) times daily before a meal.  60 capsule  6  . predniSONE (DELTASONE) 10 MG tablet Take 10 mg by mouth daily.      Marland Kitchen. pyridostigmine (MESTINON) 60 MG tablet Take 0.5 tablets (30 mg total) by mouth 4 (four) times daily.  60 tablet  5   No current facility-administered medications on file prior to visit.     Past Medical History  Diagnosis Date  . Asthma   . Chronic kidney disease     stage III, baseline Cr 1.5-1.6  . Myasthenia gravis   . Diverticulosis   . Hyperlipidemia   . CHF (congestive heart failure)   . Arthritis   . Paroxysmal atrial fibrillation   . Chronic diastolic heart failure     With moderate aortic valve insufficiency and moderate pulmonary hypertension  . Lower GI bleed     due to diverticulosis  . Left foot drop 02/28/2013  . Hypertension   . Moderate aortic insufficiency   . Gout   . Thyroid disease      Past Surgical History  Procedure Laterality Date  . Cataract extraction    . Colonoscopy       Family History  Problem Relation Age of Onset  . Heart disease Sister   . Hypertension Mother   . Hyperlipidemia Mother   . Hypertension Father   .  Hyperlipidemia Father      History   Social History  . Marital Status: Single    Spouse Name: N/A    Number of Children: N/A  . Years of Education: N/A   Occupational History  . Not on file.   Social History Main Topics  . Smoking status: Former Smoker -- 0.50 packs/day for 10 years    Types: Cigarettes  . Smokeless tobacco: Not on file  . Alcohol Use: No  . Drug Use: No  . Sexual Activity: Not on file   Other Topics Concern  . Not on file   Social History Narrative  . No narrative on file     PHYSICAL EXAM   BP 176/74  Pulse 58  Ht 5\' 6"  (1.676 m)  Wt 114 lb (51.71 kg)  BMI 18.41 kg/m2 Constitutional: She is oriented to person, place, and time. She appears well-developed and well-nourished. No  distress.  HENT: No nasal discharge.  Head: Normocephalic and atraumatic.  Eyes: Pupils are equal and round. Right eye exhibits no discharge. Left eye exhibits no discharge.  Neck: Normal range of motion. Neck supple. No JVD present. No thyromegaly present.  Cardiovascular: Normal rate, regular rhythm, normal heart sounds. Exam reveals no gallop and no friction rub. There is a 2/6 systolic ejection murmur at the aortic area which is early peaking. Pulmonary/Chest: Effort normal and breath sounds normal. No stridor. No respiratory distress. She has no wheezes. She has no rales. She exhibits no tenderness.  Abdominal: Soft. Bowel sounds are normal. She exhibits no distension. There is no tenderness. There is no rebound and no guarding.  Musculoskeletal: Normal range of motion. She exhibits no edema and no tenderness.  Neurological: She is alert and oriented to person, place, and time. Coordination normal.  Skin: Skin is warm and dry. No rash noted. She is not diaphoretic. No erythema. No pallor.  Psychiatric: She has a normal mood and affect. Her behavior is normal. Judgment and thought content normal.     EKG: Sinus bradycardia with a heart rate of 58 beats per minute.  ASSESSMENT AND PLAN

## 2013-11-21 LAB — TSH: TSH: 1.27 u[IU]/mL (ref 0.450–4.500)

## 2013-11-21 LAB — HEPATIC FUNCTION PANEL
ALK PHOS: 45 IU/L (ref 39–117)
ALT: 39 IU/L — ABNORMAL HIGH (ref 0–32)
AST: 32 IU/L (ref 0–40)
Albumin: 3.4 g/dL — ABNORMAL LOW (ref 3.5–4.7)
Bilirubin, Direct: 0.09 mg/dL (ref 0.00–0.40)
TOTAL PROTEIN: 5.6 g/dL — AB (ref 6.0–8.5)
Total Bilirubin: 0.2 mg/dL (ref 0.0–1.2)

## 2013-12-04 ENCOUNTER — Other Ambulatory Visit: Payer: Self-pay | Admitting: Neurology

## 2013-12-06 ENCOUNTER — Ambulatory Visit (INDEPENDENT_AMBULATORY_CARE_PROVIDER_SITE_OTHER): Payer: Medicare Other | Admitting: Neurology

## 2013-12-06 ENCOUNTER — Encounter: Payer: Self-pay | Admitting: Neurology

## 2013-12-06 VITALS — BP 157/69 | HR 77 | Wt 109.0 lb

## 2013-12-06 DIAGNOSIS — G7000 Myasthenia gravis without (acute) exacerbation: Secondary | ICD-10-CM

## 2013-12-06 DIAGNOSIS — R1310 Dysphagia, unspecified: Secondary | ICD-10-CM

## 2013-12-06 NOTE — Progress Notes (Signed)
Reason for visit: Myasthenia gravis  [name] Duffy is an 80 y.o. female  History of present illness:  Ms. [name] Duffy is an 80 year old right-handed black female with a history of myasthenia gravis. The patient primarily has ocular features. The patient has ptosis of the left eye that has persisted. The patient indicates that she has issues with getting the left eye open times, not because of weakness, but because she has "crust" that keeps her eyelid closed. The patient fortunately will open the eyelid, and then once it is opened, she does not have troubles with the ptosis closing the eye. The patient denies double vision. The patient has had an upper endoscopy, this was done at [facility]. The patient has difficulty describing what was done, but the patient could eventually have had esophageal dilation. The patient indicates that she is having trouble getting "strangled" when she eats or tries to take her medications. She appears to have some difficulty describing exactly what happens when she eats. She indicates that there has been no gross change in strength of the arms or legs. She walks with a walker, and she has not had any falls. The patient reports some issues with drooling that has come on since last seen.  Past Medical History  Diagnosis Date  . Asthma   . Chronic kidney disease     stage III, baseline Cr 1.5-1.6  . Myasthenia gravis   . Diverticulosis   . Hyperlipidemia   . CHF (congestive heart failure)   . Arthritis   . Paroxysmal atrial fibrillation   . Chronic diastolic heart failure     With moderate aortic valve insufficiency and moderate pulmonary hypertension  . Lower GI bleed     due to diverticulosis  . Left foot drop 02/28/2013  . Hypertension   . Moderate aortic insufficiency   . Gout   . Thyroid disease     Past Surgical History  Procedure Laterality Date  . Cataract extraction    . Colonoscopy      Family History  Problem Relation Age of Onset  . Heart  disease Sister   . Hypertension Mother   . Hyperlipidemia Mother   . Hypertension Father   . Hyperlipidemia Father     Social history:  reports that she has quit smoking. Her smoking use included Cigarettes. She has a 5 pack-year smoking history. She has never used smokeless tobacco. She reports that she does not drink alcohol or use illicit drugs.   No Known Allergies  Medications:  Current Outpatient Prescriptions on File Prior to Visit  Medication Sig Dispense Refill  . acetaminophen (MAPAP) 325 MG tablet Take 650 mg by mouth every 6 (six) hours as needed for pain.      Marland Kitchen. amiodarone (PACERONE) 200 MG tablet Take 1 tablet (200 mg total) by mouth daily.  30 tablet  6  . amLODipine (NORVASC) 5 MG tablet Take 1 tablet (5 mg total) by mouth daily.  30 tablet  6  . docusate sodium (COLACE) 100 MG capsule Take 100 mg by mouth daily.       . ferrous sulfate 325 (65 FE) MG tablet Take 325 mg by mouth daily with breakfast.       . furosemide (LASIX) 40 MG tablet Take 40 mg by mouth daily as needed.      . hydrALAZINE (APRESOLINE) 100 MG tablet Take 1 tablet (100 mg total) by mouth 2 (two) times daily.  60 tablet  6  . levothyroxine (SYNTHROID, LEVOTHROID)  25 MCG tablet Take 25 mcg by mouth daily before breakfast.       . omeprazole (PRILOSEC) 20 MG capsule Take 1 capsule (20 mg total) by mouth 2 (two) times daily before a meal.  60 capsule  6  . predniSONE (DELTASONE) 10 MG tablet Take 1 tablet (10 mg total) by mouth daily.  90 tablet  0   No current facility-administered medications on file prior to visit.    ROS:  Out of a complete 14 system review of symptoms, the patient complains only of the following symptoms, and all other reviewed systems are negative.  Fever Ringing in the ears, runny nose, drooling Eye itching, eye redness, double vision Cough, shortness of breath Heat intolerance Abdominal pain, black stools, constipation Restless legs Anemia Headache, weakness, facial  drooping  Joint pain, joint swelling, aching muscles, walking difficulties, neck pain, neck stiffness Skin rash, moles  Blood pressure 157/69, pulse 77, weight 109 lb (49.442 kg).  Physical Exam  General: The patient is alert and cooperative at the time of the examination.  Skin:  1+ edema at the ankles is noted bilaterally.   Neurologic Exam  Mental status: The patient is oriented x 3.  Cranial nerves: Facial symmetry is not present. Left-sided ptosis is seen. Speech is normal, no aphasia or dysarthria is noted. Extraocular movements are full. Visual fields are full. With superior deviation of the eyes for 1 minute, no reported double vision is noted, no divergence of gaze is seen, and no increase in ptosis of the left eye is noted.  Motor: The patient has good strength in all 4 extremities, with exception that there is a mild left footdrop. With the arms outstretched 1 minute, no fatigable weakness of the deltoid muscles is seen.  Sensory examination: Soft touch sensation is symmetric on the face, arms, and legs.  Coordination: The patient has good finger-nose-finger and heel-to-shin bilaterally. The patient has apraxia with the use of the lower extremities.  Gait and station: The patient has a slightly wide-based, unsteady gait. The patient uses a walker for ambulation. Tandem gait was not attempted. The patient is unable to arise from a seated position with arms crossed. Romberg is negative. No drift is seen.  Reflexes: Deep tendon reflexes are symmetric.   Assessment/Plan:  One. Myasthenia gravis  2. Reported dysphagia  The patient was recently in the hospital with pneumonia. The patient has a lot of difficulty describing what is happening with her swallowing, but she indicates that she is getting "strangled" when she tries to swallow. We will send her for a FEES study to evaluate the swallowing. Given the history of drooling, the Mestinon dose will be reduced from 30 mg 4  times daily to 30 mg 3 times daily. She will followup in about 6 months. I see no evidence that the patient is having any significant weakness from the myasthenia gravis. She is on home oxygen currently.  Marlan Palau. Keith Amarilis Belflower MD 12/06/2013 6:54 PM  [facility] [address] [place], [place] [id number]  Phone [phone] Fax [phone]

## 2013-12-06 NOTE — Patient Instructions (Signed)
Myasthenia Gravis Myasthenia gravis is a disease that causes muscle weakness throughout the body. The muscles affected are the ones we can control (voluntary muscles). An example of a voluntary muscle is your hand muscles. You can control the muscles to make the hand pick something up. An example of an involuntary muscle is the heart. The heart beats without any direction from you.  Myasthenia Gravis is thought to be an autoimmune disease. That means that normal defenses of the body begin to attack the body. In this case, the immune system begins to attack cells located at the junctions of the muscles and the nerves. Women are affected more often. Women are affected at a younger age than men. Babies born to affected women frequently develop symptoms at an early age. SYMPTOMS Initially in the disease, the facial muscles are affected first. After this, a person may develop droopy eyelids. They may have difficulty controlling facial muscles. They may have problems chewing. Swallowing and speaking may become impaired. The weakness gradually spreads to the arms and legs. It begins to affect breathing. Sometimes, the symptoms lessen or go away without any apparent cause. DIAGNOSIS  Diagnosis can be made with blood tests. Tests such as electromyography may be done to examine the electrical activity in the muscle. An improvement in symptoms after having an anti-cholinesterase drug helps confirm the diagnosis.  TREATMENT  Medicines are usually prescribed as the first treatment. These medicines help, but they do not cure the disease. A plasma cleansing procedure (plasmapheresis) can be used to treat a crisis. It can also be used to prepare a person for surgery. This procedure produces short-term improvement. Some cases are helped by removing the thymus gland. Steroids are used for short-term benefits. Document Released: 11/09/2000 Document Revised: 10/26/2011 Document Reviewed: 08/03/2005 [facility] Patient  Information 2014 [employer], [place]. Myasthenia Gravis Myasthenia gravis is a disease that causes muscle weakness throughout the body. The muscles affected are the ones we can control (voluntary muscles). An example of a voluntary muscle is your hand muscles. You can control the muscles to make the hand pick something up. An example of an involuntary muscle is the heart. The heart beats without any direction from you.  Myasthenia Gravis is thought to be an autoimmune disease. That means that normal defenses of the body begin to attack the body. In this case, the immune system begins to attack cells located at the junctions of the muscles and the nerves. Women are affected more often. Women are affected at a younger age than men. Babies born to affected women frequently develop symptoms at an early age. SYMPTOMS Initially in the disease, the facial muscles are affected first. After this, a person may develop droopy eyelids. They may have difficulty controlling facial muscles. They may have problems chewing. Swallowing and speaking may become impaired. The weakness gradually spreads to the arms and legs. It begins to affect breathing. Sometimes, the symptoms lessen or go away without any apparent cause. DIAGNOSIS  Diagnosis can be made with blood tests. Tests such as electromyography may be done to examine the electrical activity in the muscle. An improvement in symptoms after having an anti-cholinesterase drug helps confirm the diagnosis.  TREATMENT  Medicines are usually prescribed as the first treatment. These medicines help, but they do not cure the disease. A plasma cleansing procedure (plasmapheresis) can be used to treat a crisis. It can also be used to prepare a person for surgery. This procedure produces short-term improvement. Some cases are helped by removing  the thymus gland. Steroids are used for short-term benefits. Document Released: 11/09/2000 Document Revised: 10/26/2011 Document Reviewed:  08/03/2005 [facility] Patient Information 2014 [place], [place].

## 2013-12-08 ENCOUNTER — Telehealth: Payer: Self-pay

## 2013-12-08 NOTE — Telephone Encounter (Signed)
Pt caretaker called regarding Amlodipine. States pt BP is 106/48. Pt asks if she really needs to take this. Please call.

## 2013-12-08 NOTE — Telephone Encounter (Signed)
Patient stated her blood pressure has been running low at times since she has started Amlodipine  Today it was 106/48 after she took her dose  She skipped taking it one day and in the afternoon her blood pressure was about 145/60 She does not want it to be high but she thinks 106/48 is to low

## 2013-12-10 NOTE — Telephone Encounter (Signed)
Yes, decrease Amlodipine to 2.5 mg once daily.

## 2013-12-11 MED ORDER — AMLODIPINE BESYLATE 5 MG PO TABS: 2.5000 mg | ORAL_TABLET | Freq: Every day | ORAL | Status: AC

## 2013-12-11 NOTE — Telephone Encounter (Signed)
Informed patient that per Dr. Kirke CorinArida decrease Amlodipine to 2.5 mg daily  Instructed patient to call if blood pressure trended to high or low  Patient verbalized understanding

## 2013-12-27 LAB — BASIC METABOLIC PANEL
Anion Gap: 3 — ABNORMAL LOW (ref 7–16)
BUN: 28 mg/dL — ABNORMAL HIGH (ref 7–18)
CALCIUM: 9.6 mg/dL (ref 8.5–10.1)
CHLORIDE: 110 mmol/L — AB (ref 98–107)
Co2: 31 mmol/L (ref 21–32)
Creatinine: 1.55 mg/dL — ABNORMAL HIGH (ref 0.60–1.30)
EGFR (African American): 36 — ABNORMAL LOW
EGFR (Non-African Amer.): 31 — ABNORMAL LOW
Glucose: 81 mg/dL (ref 65–99)
Osmolality: 291 (ref 275–301)
Potassium: 5.1 mmol/L (ref 3.5–5.1)
SODIUM: 144 mmol/L (ref 136–145)

## 2013-12-27 LAB — TROPONIN I

## 2013-12-27 LAB — CBC
HCT: 32.1 % — ABNORMAL LOW (ref 35.0–47.0)
HGB: 10.3 g/dL — AB (ref 12.0–16.0)
MCH: 30.3 pg (ref 26.0–34.0)
MCHC: 32 g/dL (ref 32.0–36.0)
MCV: 95 fL (ref 80–100)
Platelet: 225 10*3/uL (ref 150–440)
RBC: 3.4 10*6/uL — ABNORMAL LOW (ref 3.80–5.20)
RDW: 14.9 % — ABNORMAL HIGH (ref 11.5–14.5)
WBC: 9.6 10*3/uL (ref 3.6–11.0)

## 2013-12-28 ENCOUNTER — Inpatient Hospital Stay: Payer: Self-pay | Admitting: Internal Medicine

## 2013-12-28 ENCOUNTER — Ambulatory Visit: Payer: Self-pay | Admitting: Neurology

## 2013-12-29 LAB — CBC WITH DIFFERENTIAL/PLATELET
BASOS PCT: 0.1 %
Basophil #: 0 10*3/uL (ref 0.0–0.1)
Eosinophil #: 0 10*3/uL (ref 0.0–0.7)
Eosinophil %: 0 %
HCT: 25.7 % — ABNORMAL LOW (ref 35.0–47.0)
HGB: 8.5 g/dL — ABNORMAL LOW (ref 12.0–16.0)
LYMPHS PCT: 14 %
Lymphocyte #: 0.7 10*3/uL — ABNORMAL LOW (ref 1.0–3.6)
MCH: 31.1 pg (ref 26.0–34.0)
MCHC: 33.2 g/dL (ref 32.0–36.0)
MCV: 94 fL (ref 80–100)
Monocyte #: 0.2 x10 3/mm (ref 0.2–0.9)
Monocyte %: 3.5 %
NEUTROS ABS: 4.1 10*3/uL (ref 1.4–6.5)
Neutrophil %: 82.4 %
PLATELETS: 185 10*3/uL (ref 150–440)
RBC: 2.74 10*6/uL — AB (ref 3.80–5.20)
RDW: 14.9 % — ABNORMAL HIGH (ref 11.5–14.5)
WBC: 4.9 10*3/uL (ref 3.6–11.0)

## 2013-12-29 LAB — BASIC METABOLIC PANEL
Anion Gap: 3 — ABNORMAL LOW (ref 7–16)
BUN: 30 mg/dL — ABNORMAL HIGH (ref 7–18)
CO2: 30 mmol/L (ref 21–32)
Calcium, Total: 8.9 mg/dL (ref 8.5–10.1)
Chloride: 113 mmol/L — ABNORMAL HIGH (ref 98–107)
Creatinine: 1.77 mg/dL — ABNORMAL HIGH (ref 0.60–1.30)
EGFR (African American): 31 — ABNORMAL LOW
EGFR (Non-African Amer.): 27 — ABNORMAL LOW
Glucose: 117 mg/dL — ABNORMAL HIGH (ref 65–99)
OSMOLALITY: 298 (ref 275–301)
Potassium: 5 mmol/L (ref 3.5–5.1)
Sodium: 146 mmol/L — ABNORMAL HIGH (ref 136–145)

## 2013-12-29 LAB — MAGNESIUM: Magnesium: 2.2 mg/dL

## 2013-12-30 LAB — CBC WITH DIFFERENTIAL/PLATELET
BASOS PCT: 0 %
Basophil #: 0 10*3/uL (ref 0.0–0.1)
EOS PCT: 0 %
Eosinophil #: 0 10*3/uL (ref 0.0–0.7)
HCT: 27 % — ABNORMAL LOW (ref 35.0–47.0)
HGB: 8.9 g/dL — AB (ref 12.0–16.0)
Lymphocyte #: 1 10*3/uL (ref 1.0–3.6)
Lymphocyte %: 10.6 %
MCH: 30.5 pg (ref 26.0–34.0)
MCHC: 33 g/dL (ref 32.0–36.0)
MCV: 93 fL (ref 80–100)
Monocyte #: 1 x10 3/mm — ABNORMAL HIGH (ref 0.2–0.9)
Monocyte %: 10.9 %
Neutrophil #: 7.1 10*3/uL — ABNORMAL HIGH (ref 1.4–6.5)
Neutrophil %: 78.5 %
Platelet: 170 10*3/uL (ref 150–440)
RBC: 2.91 10*6/uL — AB (ref 3.80–5.20)
RDW: 14.9 % — ABNORMAL HIGH (ref 11.5–14.5)
WBC: 9.1 10*3/uL (ref 3.6–11.0)

## 2013-12-30 LAB — BASIC METABOLIC PANEL
ANION GAP: 1 — AB (ref 7–16)
BUN: 32 mg/dL — ABNORMAL HIGH (ref 7–18)
CALCIUM: 9.1 mg/dL (ref 8.5–10.1)
Chloride: 110 mmol/L — ABNORMAL HIGH (ref 98–107)
Co2: 31 mmol/L (ref 21–32)
Creatinine: 1.75 mg/dL — ABNORMAL HIGH (ref 0.60–1.30)
GFR CALC AF AMER: 31 — AB
GFR CALC NON AF AMER: 27 — AB
Glucose: 81 mg/dL (ref 65–99)
Osmolality: 289 (ref 275–301)
Potassium: 4.6 mmol/L (ref 3.5–5.1)
Sodium: 142 mmol/L (ref 136–145)

## 2013-12-30 LAB — BETA STREP CULTURE(ARMC)

## 2014-01-24 ENCOUNTER — Telehealth: Payer: Self-pay | Admitting: Neurology

## 2014-01-24 ENCOUNTER — Other Ambulatory Visit: Payer: Self-pay | Admitting: Neurology

## 2014-01-24 ENCOUNTER — Other Ambulatory Visit: Payer: Self-pay | Admitting: Cardiovascular Disease

## 2014-01-24 MED ORDER — PREDNISONE 10 MG PO TABS: 10.0000 mg | ORAL_TABLET | Freq: Every day | ORAL | Status: AC

## 2014-01-24 NOTE — Telephone Encounter (Signed)
Darel Hong from [facility] states she is bubble wrapping patient's medication and she needs a Prednisone script for patient. Please return call and advise.

## 2014-01-24 NOTE — Telephone Encounter (Signed)
Called Darel Hong at [facility] and left message to give our office a call back.

## 2014-01-29 NOTE — Telephone Encounter (Signed)
I have been out of the office since 06/06. This Rx was sent to [facility] in [place] on 06/10.  I called back.  Got no answer.  Left message Rx was sent to pharmacy and asked that they call us back if further assistance is needed.

## 2014-01-29 NOTE — Telephone Encounter (Signed)
Medication questions. Please advise

## 2014-02-05 ENCOUNTER — Encounter: Payer: Self-pay | Admitting: *Deleted

## 2014-02-05 ENCOUNTER — Telehealth: Payer: Self-pay | Admitting: *Deleted

## 2014-02-05 NOTE — Telephone Encounter (Signed)
Sent medical records request to [employer] from [facility].

## 2014-02-05 NOTE — Telephone Encounter (Signed)
Mailed [employer] payment form to patient's address.

## 2014-02-08 LAB — CBC
HCT: 28.5 % — ABNORMAL LOW (ref 35.0–47.0)
HGB: 9.3 g/dL — ABNORMAL LOW (ref 12.0–16.0)
MCH: 30.5 pg (ref 26.0–34.0)
MCHC: 32.6 g/dL (ref 32.0–36.0)
MCV: 94 fL (ref 80–100)
Platelet: 185 10*3/uL (ref 150–440)
RBC: 3.05 10*6/uL — ABNORMAL LOW (ref 3.80–5.20)
RDW: 14.1 % (ref 11.5–14.5)
WBC: 10.9 10*3/uL (ref 3.6–11.0)

## 2014-02-08 LAB — URINALYSIS, COMPLETE
BILIRUBIN, UR: NEGATIVE
Glucose,UR: NEGATIVE mg/dL (ref 0–75)
Ketone: NEGATIVE
NITRITE: NEGATIVE
PROTEIN: NEGATIVE
Ph: 5 (ref 4.5–8.0)
SPECIFIC GRAVITY: 1.01 (ref 1.003–1.030)
Squamous Epithelial: 12
WBC UR: 5 /HPF (ref 0–5)

## 2014-02-08 LAB — COMPREHENSIVE METABOLIC PANEL
ALBUMIN: 2.9 g/dL — AB (ref 3.4–5.0)
ALK PHOS: 57 U/L
ANION GAP: 4 — AB (ref 7–16)
AST: 14 U/L — AB (ref 15–37)
BILIRUBIN TOTAL: 0.3 mg/dL (ref 0.2–1.0)
BUN: 39 mg/dL — ABNORMAL HIGH (ref 7–18)
CHLORIDE: 110 mmol/L — AB (ref 98–107)
CO2: 29 mmol/L (ref 21–32)
Calcium, Total: 9.5 mg/dL (ref 8.5–10.1)
Creatinine: 2.56 mg/dL — ABNORMAL HIGH (ref 0.60–1.30)
EGFR (Non-African Amer.): 17 — ABNORMAL LOW
GFR CALC AF AMER: 20 — AB
GLUCOSE: 90 mg/dL (ref 65–99)
OSMOLALITY: 294 (ref 275–301)
Potassium: 5.6 mmol/L — ABNORMAL HIGH (ref 3.5–5.1)
SGPT (ALT): 16 U/L (ref 12–78)
SODIUM: 143 mmol/L (ref 136–145)
Total Protein: 6.6 g/dL (ref 6.4–8.2)

## 2014-02-08 LAB — TROPONIN I

## 2014-02-08 LAB — CK TOTAL AND CKMB (NOT AT ARMC)
CK, Total: 38 U/L
CK-MB: 1.3 ng/mL (ref 0.5–3.6)

## 2014-02-09 ENCOUNTER — Inpatient Hospital Stay: Payer: Self-pay | Admitting: Internal Medicine

## 2014-02-09 LAB — CBC WITH DIFFERENTIAL/PLATELET
Basophil #: 0.1 10*3/uL (ref 0.0–0.1)
Basophil %: 0.6 %
Eosinophil #: 0.1 10*3/uL (ref 0.0–0.7)
Eosinophil %: 1.1 %
HCT: 23 % — ABNORMAL LOW (ref 35.0–47.0)
HGB: 7.4 g/dL — ABNORMAL LOW (ref 12.0–16.0)
Lymphocyte #: 2.2 10*3/uL (ref 1.0–3.6)
Lymphocyte %: 24.1 %
MCH: 30.2 pg (ref 26.0–34.0)
MCHC: 32 g/dL (ref 32.0–36.0)
MCV: 94 fL (ref 80–100)
Monocyte #: 1.2 x10 3/mm — ABNORMAL HIGH (ref 0.2–0.9)
Monocyte %: 13.1 %
NEUTROS ABS: 5.6 10*3/uL (ref 1.4–6.5)
Neutrophil %: 61.1 %
Platelet: 156 10*3/uL (ref 150–440)
RBC: 2.44 10*6/uL — AB (ref 3.80–5.20)
RDW: 14.1 % (ref 11.5–14.5)
WBC: 9.1 10*3/uL (ref 3.6–11.0)

## 2014-02-09 LAB — BASIC METABOLIC PANEL
Anion Gap: 5 — ABNORMAL LOW (ref 7–16)
BUN: 33 mg/dL — ABNORMAL HIGH (ref 7–18)
CALCIUM: 8.8 mg/dL (ref 8.5–10.1)
CHLORIDE: 114 mmol/L — AB (ref 98–107)
Co2: 27 mmol/L (ref 21–32)
Creatinine: 2.61 mg/dL — ABNORMAL HIGH (ref 0.60–1.30)
EGFR (Non-African Amer.): 17 — ABNORMAL LOW
GFR CALC AF AMER: 19 — AB
Glucose: 61 mg/dL — ABNORMAL LOW (ref 65–99)
Osmolality: 296 (ref 275–301)
POTASSIUM: 5.1 mmol/L (ref 3.5–5.1)
SODIUM: 146 mmol/L — AB (ref 136–145)

## 2014-02-10 LAB — BASIC METABOLIC PANEL
Anion Gap: 5 — ABNORMAL LOW (ref 7–16)
BUN: 27 mg/dL — AB (ref 7–18)
CO2: 26 mmol/L (ref 21–32)
CREATININE: 2.24 mg/dL — AB (ref 0.60–1.30)
Calcium, Total: 8.4 mg/dL — ABNORMAL LOW (ref 8.5–10.1)
Chloride: 113 mmol/L — ABNORMAL HIGH (ref 98–107)
EGFR (African American): 23 — ABNORMAL LOW
GFR CALC NON AF AMER: 20 — AB
Glucose: 70 mg/dL (ref 65–99)
Osmolality: 290 (ref 275–301)
Potassium: 4.7 mmol/L (ref 3.5–5.1)
Sodium: 144 mmol/L (ref 136–145)

## 2014-02-10 LAB — IRON AND TIBC
IRON BIND. CAP.(TOTAL): 154 ug/dL — AB (ref 250–450)
Iron Saturation: 13 %
Iron: 20 ug/dL — ABNORMAL LOW (ref 50–170)
Unbound Iron-Bind.Cap.: 134 ug/dL

## 2014-02-10 LAB — FERRITIN: Ferritin (ARMC): 161 ng/mL (ref 8–388)

## 2014-02-10 LAB — HEMOGLOBIN: HGB: 6.7 g/dL — ABNORMAL LOW (ref 12.0–16.0)

## 2014-02-11 LAB — BASIC METABOLIC PANEL
Anion Gap: 4 — ABNORMAL LOW (ref 7–16)
BUN: 26 mg/dL — ABNORMAL HIGH (ref 7–18)
CHLORIDE: 112 mmol/L — AB (ref 98–107)
CO2: 26 mmol/L (ref 21–32)
Calcium, Total: 8.4 mg/dL — ABNORMAL LOW (ref 8.5–10.1)
Creatinine: 2.19 mg/dL — ABNORMAL HIGH (ref 0.60–1.30)
EGFR (African American): 24 — ABNORMAL LOW
GFR CALC NON AF AMER: 21 — AB
Glucose: 72 mg/dL (ref 65–99)
OSMOLALITY: 286 (ref 275–301)
Potassium: 4.8 mmol/L (ref 3.5–5.1)
Sodium: 142 mmol/L (ref 136–145)

## 2014-02-11 LAB — HEMOGLOBIN: HGB: 8.2 g/dL — ABNORMAL LOW (ref 12.0–16.0)

## 2014-02-11 LAB — URINE CULTURE

## 2014-02-12 LAB — BASIC METABOLIC PANEL
ANION GAP: 2 — AB (ref 7–16)
BUN: 22 mg/dL — ABNORMAL HIGH (ref 7–18)
CHLORIDE: 110 mmol/L — AB (ref 98–107)
CO2: 28 mmol/L (ref 21–32)
CREATININE: 1.95 mg/dL — AB (ref 0.60–1.30)
Calcium, Total: 8.8 mg/dL (ref 8.5–10.1)
GFR CALC AF AMER: 27 — AB
GFR CALC NON AF AMER: 24 — AB
GLUCOSE: 178 mg/dL — AB (ref 65–99)
OSMOLALITY: 287 (ref 275–301)
POTASSIUM: 4.7 mmol/L (ref 3.5–5.1)
SODIUM: 140 mmol/L (ref 136–145)

## 2014-02-12 LAB — HEMOGLOBIN: HGB: 9.4 g/dL — ABNORMAL LOW (ref 12.0–16.0)

## 2014-02-13 ENCOUNTER — Telehealth: Payer: Self-pay | Admitting: *Deleted

## 2014-02-13 NOTE — Telephone Encounter (Signed)
Colen Darlinghrisi-ann London, NP calling about this pt.  [phone].

## 2014-02-13 NOTE — Telephone Encounter (Signed)
I called the nurse practitioner, left a message, I will try calling back again tomorrow.

## 2014-02-14 LAB — WOUND AEROBIC CULTURE

## 2014-02-14 NOTE — Telephone Encounter (Signed)
I called again, talked with Ms. [name] Duffy. The patient was recently admitted to the hospital, and she had dehydration, and anemia. The hemoglobin was in the 6 range, and she required transfusion. The patient has a history of GI bleed. The patient also found to have a TSH of around 12. She has been complaining of fatigue and lethargy. It is possible the underlying medical issues are the etiology, but there is some concern that the myasthenia gravis may be worsening. I will get her into the office for an evaluation.

## 2014-02-21 ENCOUNTER — Inpatient Hospital Stay: Payer: Self-pay | Admitting: Internal Medicine

## 2014-02-21 LAB — CK-MB
CK-MB: 1.1 ng/mL (ref 0.5–3.6)
CK-MB: 1 ng/mL (ref 0.5–3.6)

## 2014-02-21 LAB — CBC
HCT: 25.6 % — AB (ref 35.0–47.0)
HGB: 8.5 g/dL — ABNORMAL LOW (ref 12.0–16.0)
MCH: 31 pg (ref 26.0–34.0)
MCHC: 33.2 g/dL (ref 32.0–36.0)
MCV: 94 fL (ref 80–100)
PLATELETS: 251 10*3/uL (ref 150–440)
RBC: 2.74 10*6/uL — AB (ref 3.80–5.20)
RDW: 14 % (ref 11.5–14.5)
WBC: 11.7 10*3/uL — ABNORMAL HIGH (ref 3.6–11.0)

## 2014-02-21 LAB — COMPREHENSIVE METABOLIC PANEL
ALBUMIN: 2.5 g/dL — AB (ref 3.4–5.0)
ALK PHOS: 57 U/L
Anion Gap: 8 (ref 7–16)
BUN: 31 mg/dL — ABNORMAL HIGH (ref 7–18)
Bilirubin,Total: 0.3 mg/dL (ref 0.2–1.0)
CREATININE: 2.3 mg/dL — AB (ref 0.60–1.30)
Calcium, Total: 9.5 mg/dL (ref 8.5–10.1)
Chloride: 106 mmol/L (ref 98–107)
Co2: 31 mmol/L (ref 21–32)
EGFR (African American): 23 — ABNORMAL LOW
GFR CALC NON AF AMER: 19 — AB
GLUCOSE: 63 mg/dL — AB (ref 65–99)
OSMOLALITY: 293 (ref 275–301)
Potassium: 4.7 mmol/L (ref 3.5–5.1)
SGOT(AST): 15 U/L (ref 15–37)
SGPT (ALT): 9 U/L — ABNORMAL LOW (ref 12–78)
Sodium: 145 mmol/L (ref 136–145)
TOTAL PROTEIN: 6.3 g/dL — AB (ref 6.4–8.2)

## 2014-02-21 LAB — URINALYSIS, COMPLETE
BACTERIA: NONE SEEN
BLOOD: NEGATIVE
Bilirubin,UR: NEGATIVE
GLUCOSE, UR: NEGATIVE mg/dL (ref 0–75)
KETONE: NEGATIVE
LEUKOCYTE ESTERASE: NEGATIVE
NITRITE: NEGATIVE
PROTEIN: NEGATIVE
Ph: 6 (ref 4.5–8.0)
RBC,UR: NONE SEEN /HPF (ref 0–5)
SPECIFIC GRAVITY: 1.01 (ref 1.003–1.030)
Squamous Epithelial: NONE SEEN

## 2014-02-21 LAB — TROPONIN I: Troponin-I: <0.02 ng/mL

## 2014-02-21 LAB — CK TOTAL AND CKMB (NOT AT ARMC)
CK, TOTAL: 108 U/L
CK-MB: 1.2 ng/mL (ref 0.5–3.6)

## 2014-02-22 ENCOUNTER — Ambulatory Visit: Payer: Self-pay | Admitting: Neurology

## 2014-02-22 LAB — CBC WITH DIFFERENTIAL/PLATELET
BASOS PCT: 0.1 %
Basophil #: 0 10*3/uL (ref 0.0–0.1)
EOS PCT: 0 %
Eosinophil #: 0 10*3/uL (ref 0.0–0.7)
HCT: 24.7 % — ABNORMAL LOW (ref 35.0–47.0)
HGB: 8.1 g/dL — ABNORMAL LOW (ref 12.0–16.0)
Lymphocyte #: 1.1 10*3/uL (ref 1.0–3.6)
Lymphocyte %: 12.5 %
MCH: 30.8 pg (ref 26.0–34.0)
MCHC: 32.7 g/dL (ref 32.0–36.0)
MCV: 94 fL (ref 80–100)
MONOS PCT: 1.7 %
Monocyte #: 0.1 x10 3/mm — ABNORMAL LOW (ref 0.2–0.9)
NEUTROS PCT: 85.7 %
Neutrophil #: 7.5 10*3/uL — ABNORMAL HIGH (ref 1.4–6.5)
Platelet: 231 10*3/uL (ref 150–440)
RBC: 2.63 10*6/uL — ABNORMAL LOW (ref 3.80–5.20)
RDW: 13.9 % (ref 11.5–14.5)
WBC: 8.8 10*3/uL (ref 3.6–11.0)

## 2014-02-22 LAB — COMPREHENSIVE METABOLIC PANEL
ALBUMIN: 2.2 g/dL — AB (ref 3.4–5.0)
ALK PHOS: 50 U/L
AST: 15 U/L (ref 15–37)
Anion Gap: 9 (ref 7–16)
BUN: 35 mg/dL — ABNORMAL HIGH (ref 7–18)
Bilirubin,Total: 0.3 mg/dL (ref 0.2–1.0)
CHLORIDE: 106 mmol/L (ref 98–107)
CREATININE: 2.47 mg/dL — AB (ref 0.60–1.30)
Calcium, Total: 8.9 mg/dL (ref 8.5–10.1)
Co2: 30 mmol/L (ref 21–32)
EGFR (Non-African Amer.): 18 — ABNORMAL LOW
GFR CALC AF AMER: 21 — AB
Glucose: 105 mg/dL — ABNORMAL HIGH (ref 65–99)
Osmolality: 297 (ref 275–301)
Potassium: 5.1 mmol/L (ref 3.5–5.1)
SGPT (ALT): 11 U/L — ABNORMAL LOW (ref 12–78)
Sodium: 145 mmol/L (ref 136–145)
Total Protein: 6.1 g/dL — ABNORMAL LOW (ref 6.4–8.2)

## 2014-02-22 LAB — CK-MB: CK-MB: 0.7 ng/mL (ref 0.5–3.6)

## 2014-02-22 LAB — TROPONIN I: Troponin-I: <0.02 ng/mL

## 2014-02-23 ENCOUNTER — Ambulatory Visit: Payer: Self-pay | Admitting: Neurology

## 2014-02-23 LAB — BASIC METABOLIC PANEL
Anion Gap: 4 — ABNORMAL LOW (ref 7–16)
BUN: 37 mg/dL — ABNORMAL HIGH (ref 7–18)
CREATININE: 2.26 mg/dL — AB (ref 0.60–1.30)
Calcium, Total: 8.5 mg/dL (ref 8.5–10.1)
Chloride: 112 mmol/L — ABNORMAL HIGH (ref 98–107)
Co2: 30 mmol/L (ref 21–32)
EGFR (African American): 23 — ABNORMAL LOW
EGFR (Non-African Amer.): 20 — ABNORMAL LOW
GLUCOSE: 99 mg/dL (ref 65–99)
Osmolality: 299 (ref 275–301)
Potassium: 4.8 mmol/L (ref 3.5–5.1)
Sodium: 146 mmol/L — ABNORMAL HIGH (ref 136–145)

## 2014-02-24 LAB — BASIC METABOLIC PANEL
ANION GAP: 3 — AB (ref 7–16)
BUN: 35 mg/dL — ABNORMAL HIGH (ref 7–18)
CALCIUM: 8.2 mg/dL — AB (ref 8.5–10.1)
CHLORIDE: 114 mmol/L — AB (ref 98–107)
CREATININE: 2.05 mg/dL — AB (ref 0.60–1.30)
Co2: 29 mmol/L (ref 21–32)
EGFR (African American): 26 — ABNORMAL LOW
GFR CALC NON AF AMER: 22 — AB
Glucose: 86 mg/dL (ref 65–99)
Osmolality: 298 (ref 275–301)
Potassium: 4.7 mmol/L (ref 3.5–5.1)
Sodium: 146 mmol/L — ABNORMAL HIGH (ref 136–145)

## 2014-02-24 LAB — HEMOGLOBIN: HGB: 7.6 g/dL — AB (ref 12.0–16.0)

## 2014-02-26 LAB — CULTURE, BLOOD (SINGLE)

## 2014-02-26 LAB — PLATELET COUNT: Platelet: 252 10*3/uL (ref 150–440)

## 2014-02-26 NOTE — Telephone Encounter (Signed)
This encounter was created in error - please disregard.

## 2014-02-27 ENCOUNTER — Encounter: Payer: Self-pay | Admitting: Internal Medicine

## 2014-03-01 LAB — BASIC METABOLIC PANEL
Anion Gap: 3 — ABNORMAL LOW (ref 7–16)
BUN: 25 mg/dL — AB (ref 7–18)
CALCIUM: 8.8 mg/dL (ref 8.5–10.1)
CHLORIDE: 110 mmol/L — AB (ref 98–107)
Co2: 30 mmol/L (ref 21–32)
Creatinine: 1.67 mg/dL — ABNORMAL HIGH (ref 0.60–1.30)
EGFR (Non-African Amer.): 29 — ABNORMAL LOW
GFR CALC AF AMER: 33 — AB
Glucose: 94 mg/dL (ref 65–99)
Osmolality: 289 (ref 275–301)
Potassium: 5 mmol/L (ref 3.5–5.1)
Sodium: 143 mmol/L (ref 136–145)

## 2014-03-01 LAB — CBC WITH DIFFERENTIAL/PLATELET
BASOS PCT: 0.5 %
Basophil #: 0.1 10*3/uL (ref 0.0–0.1)
EOS ABS: 0.1 10*3/uL (ref 0.0–0.7)
Eosinophil %: 1.5 %
HCT: 28.4 % — AB (ref 35.0–47.0)
HGB: 9 g/dL — AB (ref 12.0–16.0)
LYMPHS ABS: 1.8 10*3/uL (ref 1.0–3.6)
Lymphocyte %: 18 %
MCH: 30 pg (ref 26.0–34.0)
MCHC: 31.7 g/dL — ABNORMAL LOW (ref 32.0–36.0)
MCV: 95 fL (ref 80–100)
MONO ABS: 0.7 x10 3/mm (ref 0.2–0.9)
MONOS PCT: 7.3 %
NEUTROS PCT: 72.7 %
Neutrophil #: 7.1 10*3/uL — ABNORMAL HIGH (ref 1.4–6.5)
Platelet: 265 10*3/uL (ref 150–440)
RBC: 3.01 10*6/uL — AB (ref 3.80–5.20)
RDW: 14.6 % — AB (ref 11.5–14.5)
WBC: 9.7 10*3/uL (ref 3.6–11.0)

## 2014-03-17 ENCOUNTER — Ambulatory Visit: Payer: Self-pay | Admitting: Internal Medicine

## 2014-03-17 ENCOUNTER — Encounter: Payer: Self-pay | Admitting: Internal Medicine

## 2014-03-24 ENCOUNTER — Inpatient Hospital Stay: Payer: Self-pay | Admitting: Internal Medicine

## 2014-03-24 LAB — URINALYSIS, COMPLETE
Bacteria: NONE SEEN
Bilirubin,UR: NEGATIVE
Blood: NEGATIVE
GLUCOSE, UR: NEGATIVE mg/dL (ref 0–75)
Ketone: NEGATIVE
Leukocyte Esterase: NEGATIVE
NITRITE: NEGATIVE
PROTEIN: NEGATIVE
Ph: 5 (ref 4.5–8.0)
RBC,UR: NONE SEEN /HPF (ref 0–5)
SPECIFIC GRAVITY: 1.01 (ref 1.003–1.030)
Squamous Epithelial: NONE SEEN
WBC UR: <1 /HPF (ref 0–5)

## 2014-03-24 LAB — CBC
HCT: 24.8 % — ABNORMAL LOW (ref 35.0–47.0)
HGB: 8 g/dL — ABNORMAL LOW (ref 12.0–16.0)
MCH: 29.8 pg (ref 26.0–34.0)
MCHC: 32.2 g/dL (ref 32.0–36.0)
MCV: 93 fL (ref 80–100)
PLATELETS: 194 10*3/uL (ref 150–440)
RBC: 2.68 10*6/uL — ABNORMAL LOW (ref 3.80–5.20)
RDW: 15.3 % — ABNORMAL HIGH (ref 11.5–14.5)
WBC: 10 10*3/uL (ref 3.6–11.0)

## 2014-03-24 LAB — COMPREHENSIVE METABOLIC PANEL
ALT: 12 U/L — AB
ANION GAP: 6 — AB (ref 7–16)
AST: 12 U/L — AB (ref 15–37)
Albumin: 2.4 g/dL — ABNORMAL LOW (ref 3.4–5.0)
Alkaline Phosphatase: 55 U/L
BUN: 41 mg/dL — ABNORMAL HIGH (ref 7–18)
Bilirubin,Total: 0.4 mg/dL (ref 0.2–1.0)
CALCIUM: 9.4 mg/dL (ref 8.5–10.1)
Chloride: 106 mmol/L (ref 98–107)
Co2: 28 mmol/L (ref 21–32)
Creatinine: 3.2 mg/dL — ABNORMAL HIGH (ref 0.60–1.30)
GFR CALC AF AMER: 15 — AB
GFR CALC NON AF AMER: 13 — AB
Glucose: 64 mg/dL — ABNORMAL LOW (ref 65–99)
Osmolality: 288 (ref 275–301)
POTASSIUM: 5.1 mmol/L (ref 3.5–5.1)
SODIUM: 140 mmol/L (ref 136–145)
Total Protein: 6.4 g/dL (ref 6.4–8.2)

## 2014-03-24 LAB — TROPONIN I: Troponin-I: <0.02 ng/mL

## 2014-03-26 LAB — BASIC METABOLIC PANEL
Anion Gap: 6 — ABNORMAL LOW (ref 7–16)
BUN: 33 mg/dL — ABNORMAL HIGH (ref 7–18)
Calcium, Total: 8.9 mg/dL (ref 8.5–10.1)
Chloride: 111 mmol/L — ABNORMAL HIGH (ref 98–107)
Co2: 26 mmol/L (ref 21–32)
Creatinine: 2.65 mg/dL — ABNORMAL HIGH (ref 0.60–1.30)
EGFR (Non-African Amer.): 16 — ABNORMAL LOW
GFR CALC AF AMER: 19 — AB
Glucose: 117 mg/dL — ABNORMAL HIGH (ref 65–99)
Osmolality: 293 (ref 275–301)
Potassium: 4.5 mmol/L (ref 3.5–5.1)
SODIUM: 143 mmol/L (ref 136–145)

## 2014-03-27 LAB — CREATININE, SERUM
CREATININE: 2.33 mg/dL — AB (ref 0.60–1.30)
EGFR (African American): 22 — ABNORMAL LOW
EGFR (Non-African Amer.): 19 — ABNORMAL LOW

## 2014-03-29 LAB — CULTURE, BLOOD (SINGLE)

## 2014-04-15 IMAGING — CR DG KNEE COMPLETE 4+V*R*
1 series · 5 of 5 positions shown · non-contrast
Comparison: none

REASON FOR EXAM: pain and swelling
COMMENTS:

PROCEDURE:     DXR - DXR KNEE RT COMP WITH OBLIQUES  - May 29, 2013  [DATE]
RESULT:     There is no evidence of fracture, dislocation, or malalignment.

[Series 1: ap · 0.17mm/px · 5 of 5 slices shown]
[im 1/5]
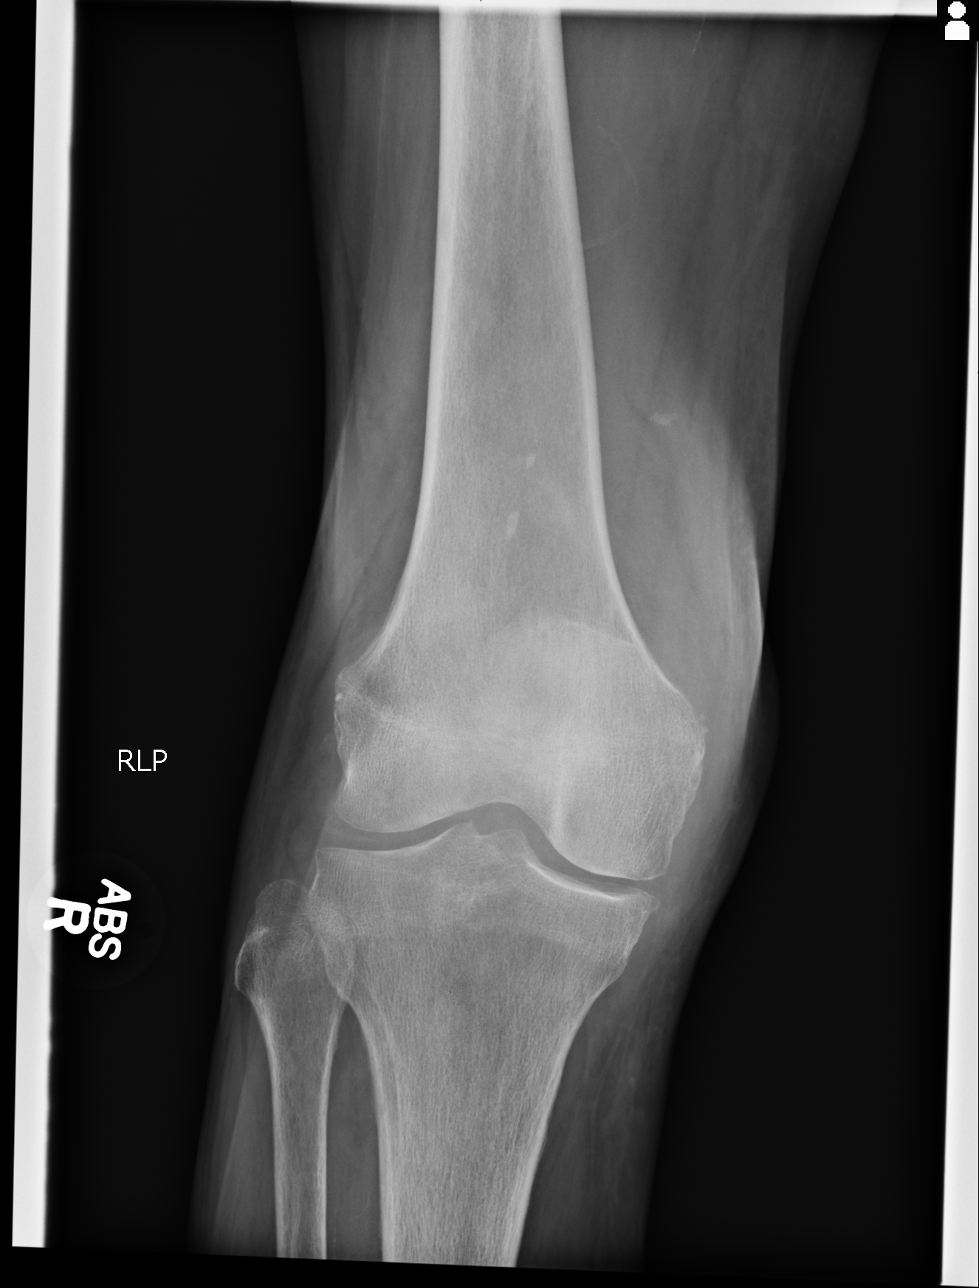
[im 2/5]
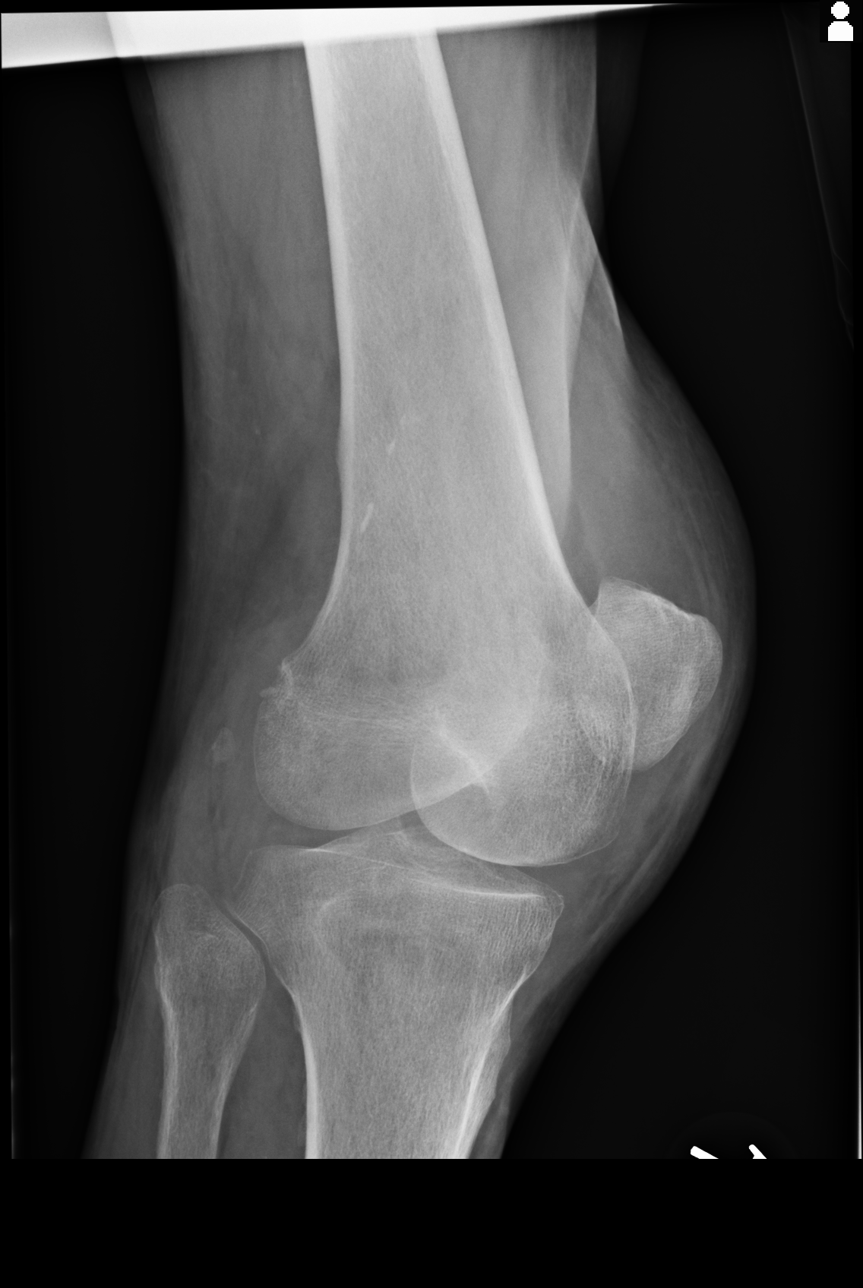
[im 3/5]
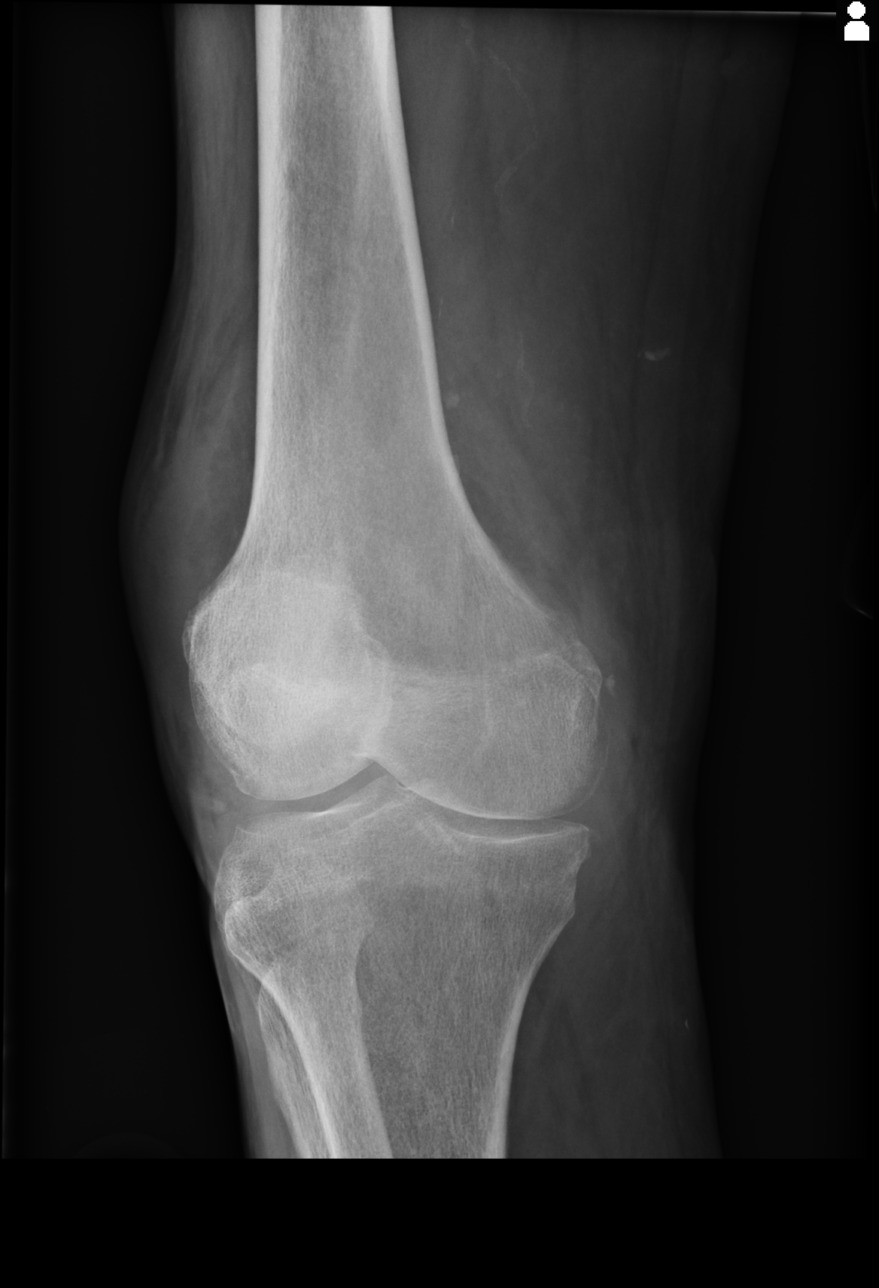
[im 4/5]
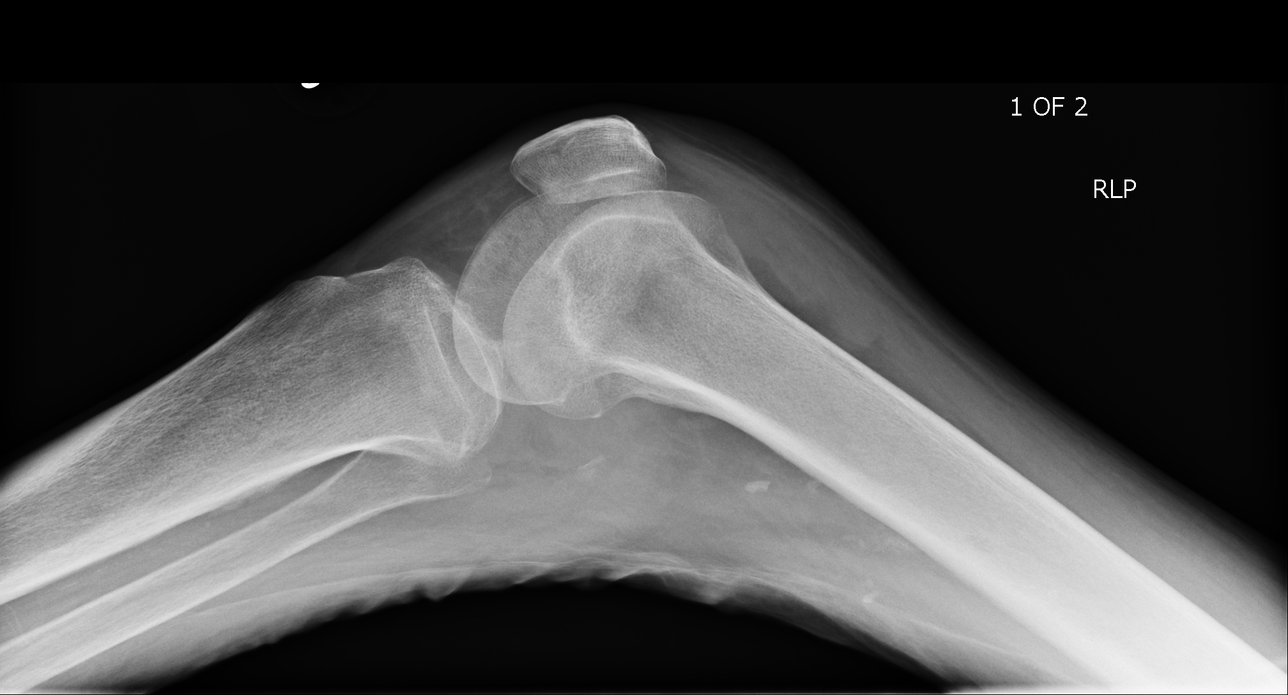
[im 5/5]
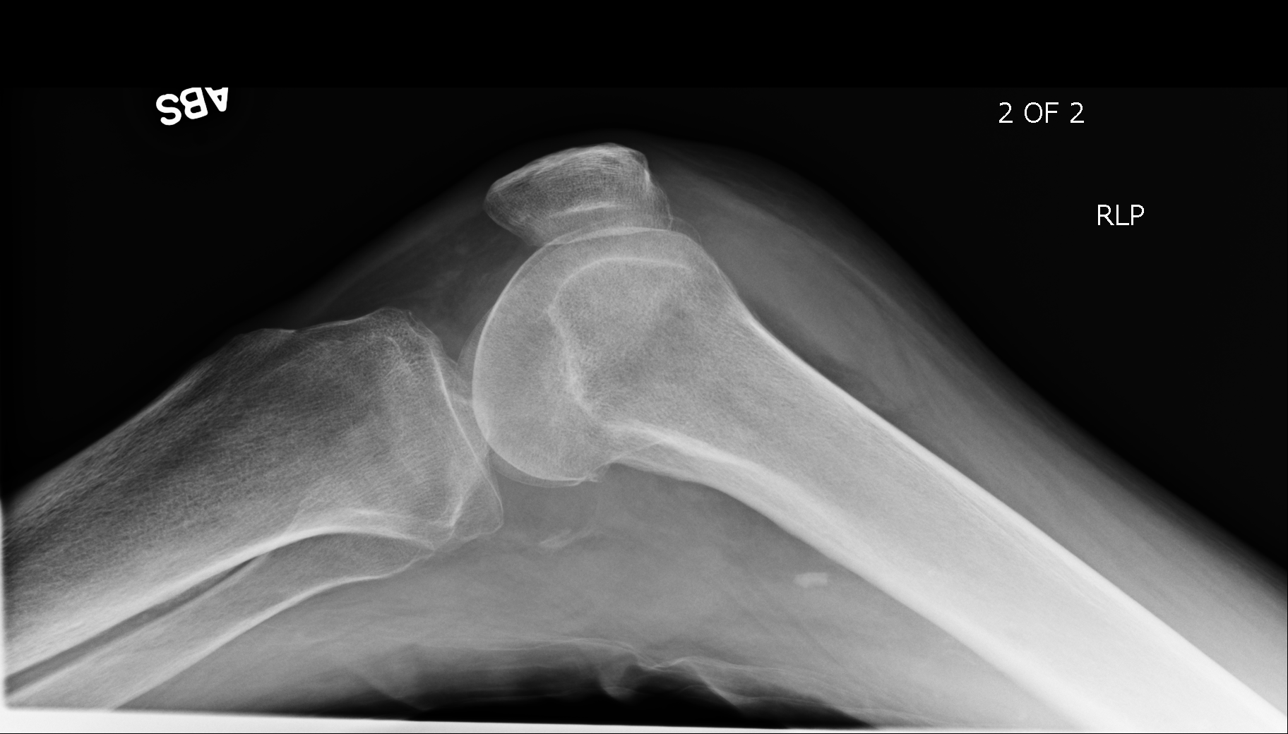

[5 of 5 positions shown; findings below may reference images not displayed]

IMPRESSION: 1. No evidence of acute abnormalities.
2. If there are persistent complaints of pain or persistent clinical
concern, a repeat evaluation in 7-10 days is recommended if clinically
warranted.

## 2014-04-17 ENCOUNTER — Ambulatory Visit: Payer: Self-pay | Admitting: Internal Medicine

## 2014-04-17 DEATH — deceased

## 2014-05-07 IMAGING — CT CT CHEST W/O CM
2 of 4 series · 15 of 36 positions shown, 18 images · non-contrast
Comparison: Chest CT 05/10/2013. CHEST x-ray 06/20/2013.

CLINICAL DATA: Evaluate effusion.

EXAM:
CT CHEST WITHOUT CONTRAST
TECHNIQUE: Multidetector CT imaging of the chest was performed following the
standard protocol without IV contrast.

[Series 2: routine chest wo · axial · 0.65mm/px · z∈[-714,-434]mm · 12 of 68 slices shown, 15 images]
[im 6/68  mediastinal]
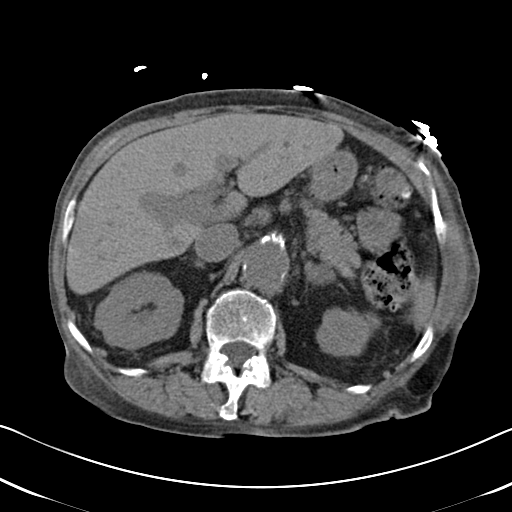
[im 6/68  lung]
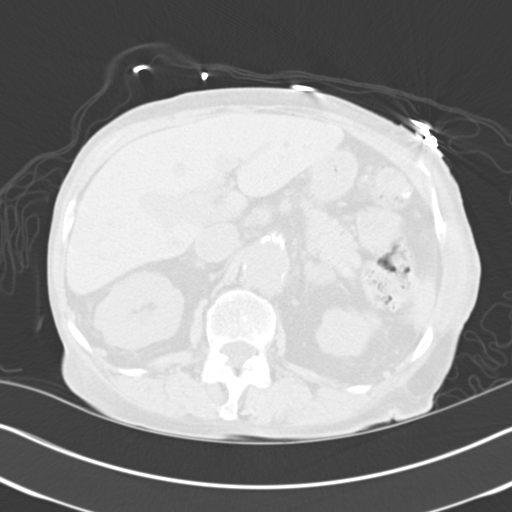
[im 11/68  lung]
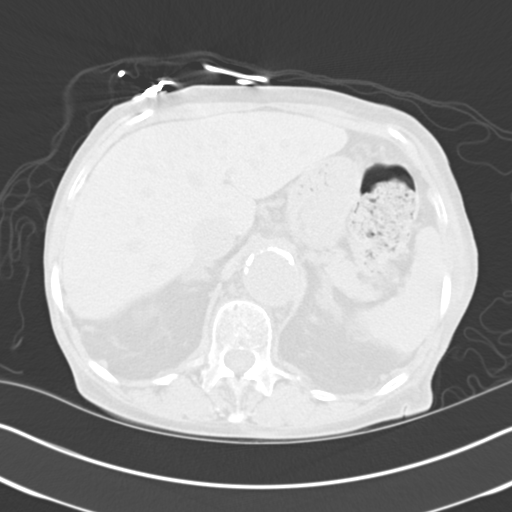
[im 16/68  lung]
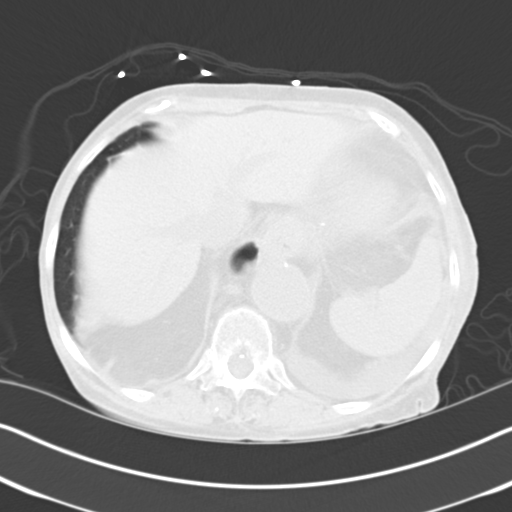
[im 21/68  lung]
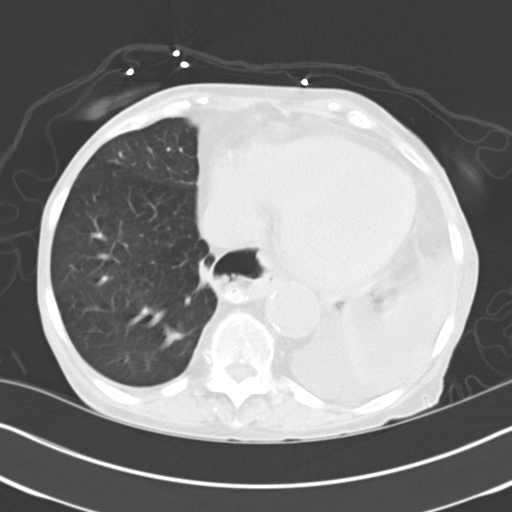
[im 26/68  mediastinal]
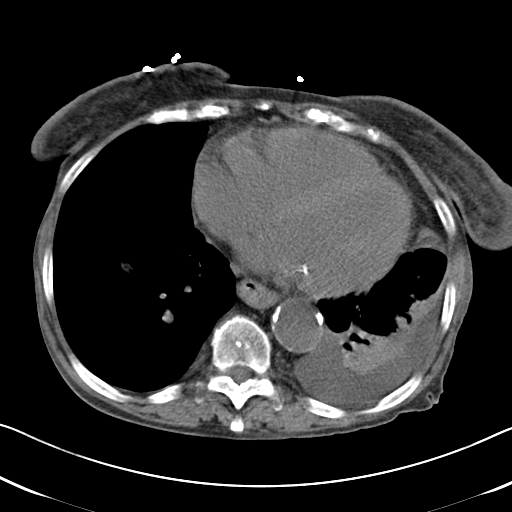
[im 26/68  lung]
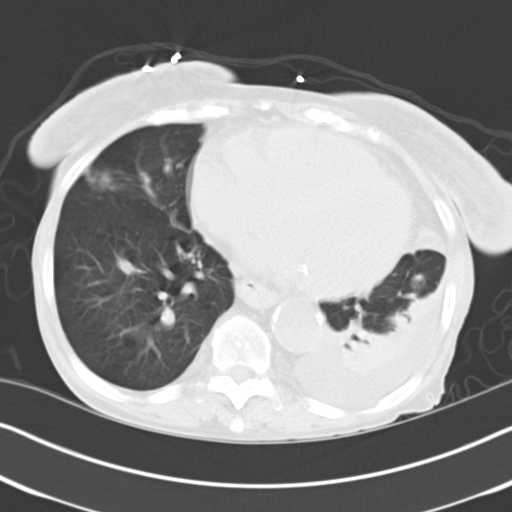
[im 31/68  lung]
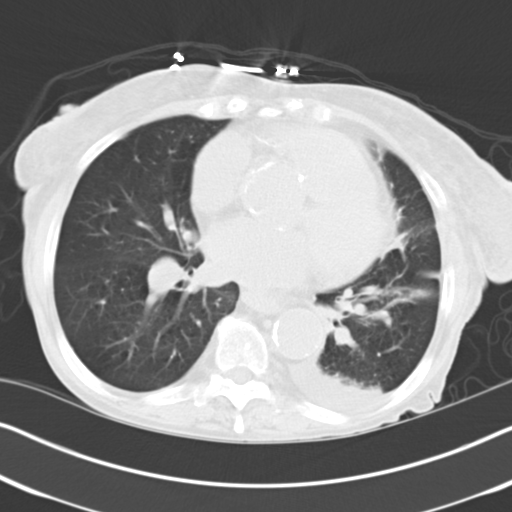
[im 37/68  lung]
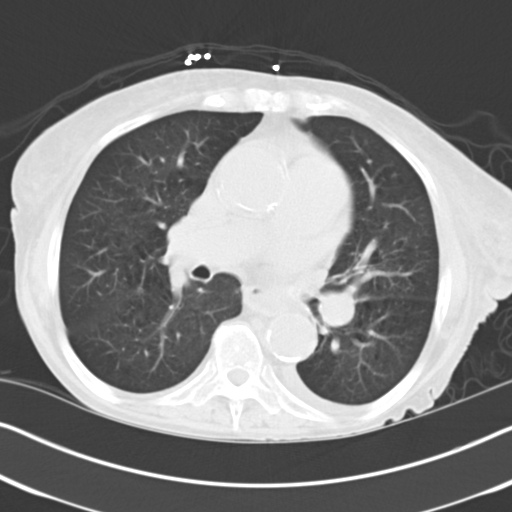
[im 42/68  lung]
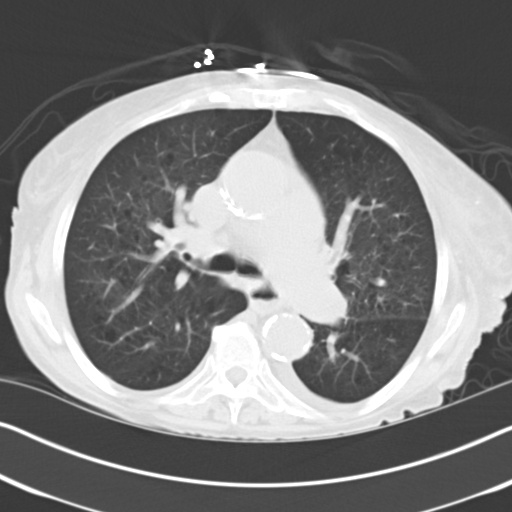
[im 47/68  mediastinal]
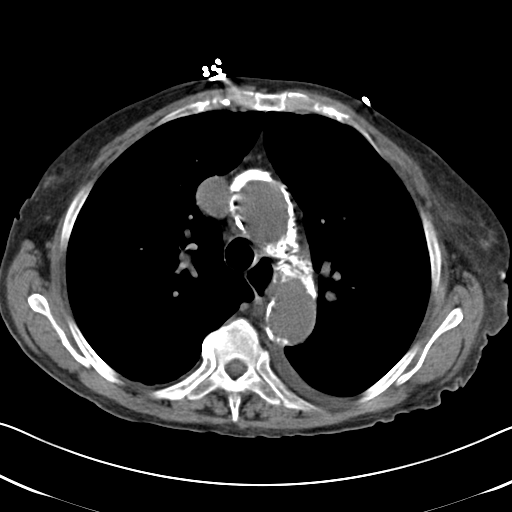
[im 47/68  lung]
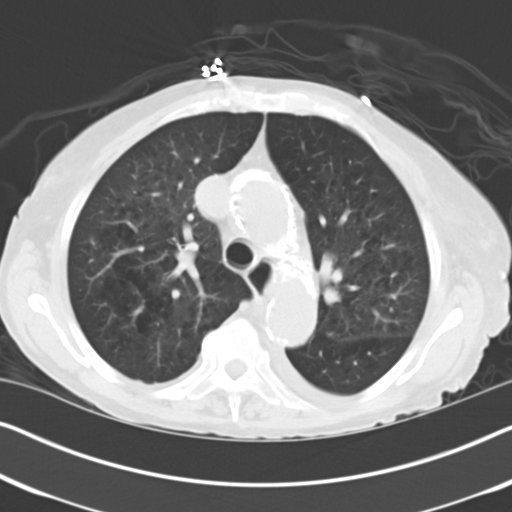
[im 52/68  lung]
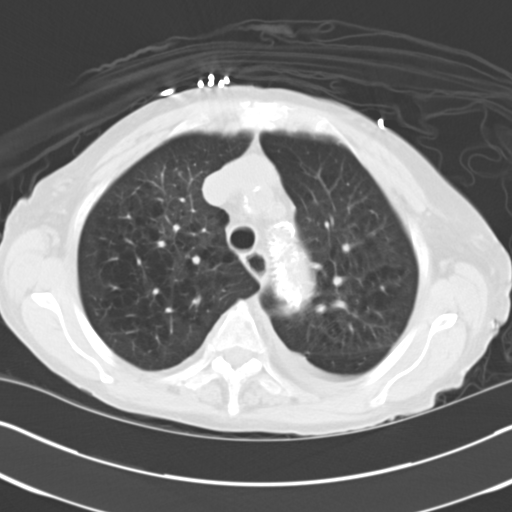
[im 57/68  lung]
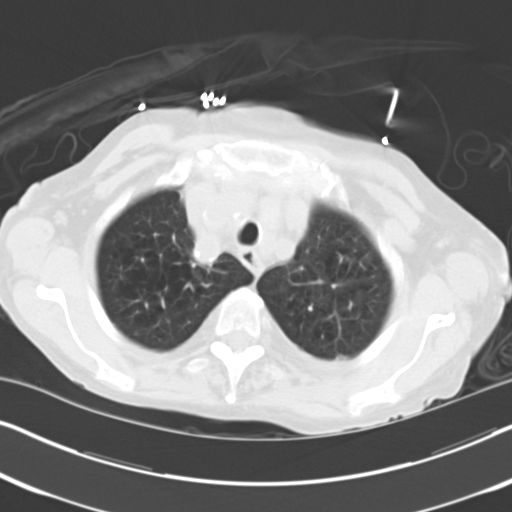
[im 62/68  lung]
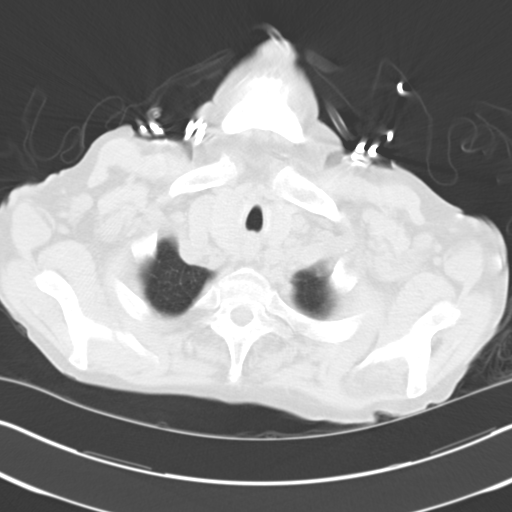

[Series 5: cor routine chest wo · coronal · 0.63mm/px · 3 of 118 slices shown]
[im 24/118  lung]
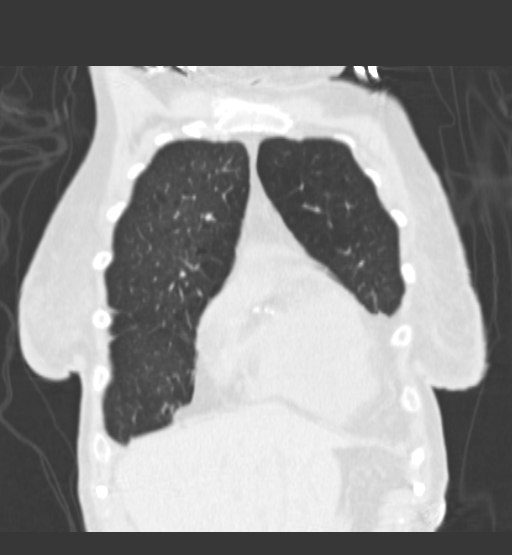
[im 47/118  lung]
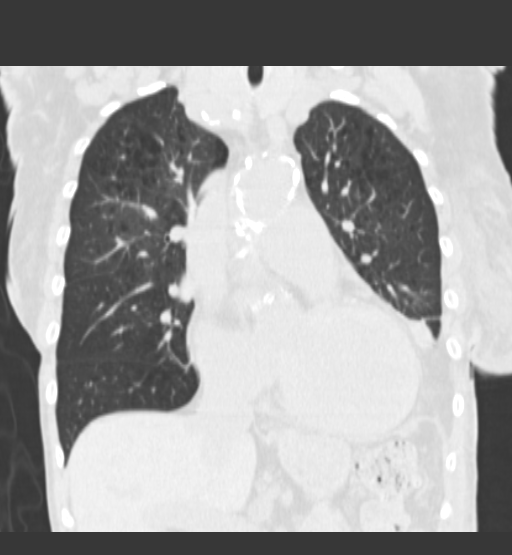
[im 71/118  lung]
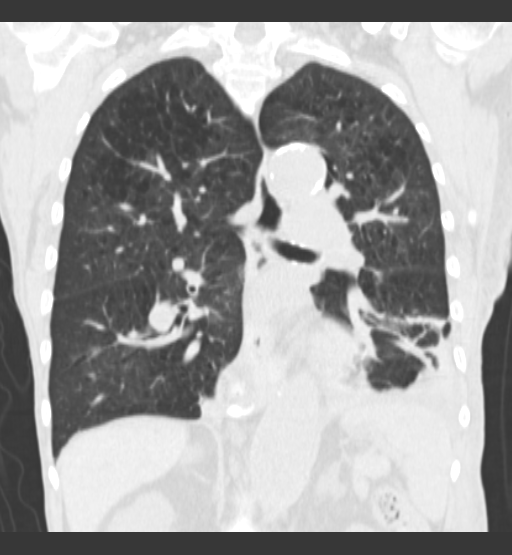

[15 of 36 positions shown; findings below may reference images not displayed]

FINDINGS: Mediastinum: Heart size is mildly enlarged. There is no significant
pericardial fluid, thickening or pericardial calcification. There is
atherosclerosis of the thoracic aorta, the great vessels of the
mediastinum and the coronary arteries, including calcified
atherosclerotic plaque in the left main, left anterior descending,
left circumflex and right coronary arteries. Ectasia of the thoracic
aorta which measures up to 4.3 cm in diameter in the ascending
thoracic aorta. Faint calcifications of the aortic valve. Decreased
attenuation of the intravascular compartment, suggestive of anemia.
No pathologically enlarged mediastinal or hilar lymph nodes. Please
note that accurate exclusion of hilar adenopathy is limited on
noncontrast CT scans. Moderate size hiatal hernia. Mild enlargement
and heterogeneity of the thyroid gland likely reflects a goiter.

Lungs/Pleura: New small left pleural effusion layering dependently.
This is associated with some passive atelectasis in the left lower
lobe and inferior segment of the lingula. No consolidative airspace
disease identified in the lungs at this time. Background of mild to
moderate centrilobular and paraseptal emphysema. No definite
suspicious appearing pulmonary nodules or masses are identified.
Next healed

Upper Abdomen: Nodular thickening of both adrenal glands
redemonstrated. In the left adrenal gland there is a 1.2 cm
low-attenuation (6 HU) nodule, compatible with an adenoma. Numerous
colonic diverticulae are noted in the region of the splenic flexure.

Musculoskeletal: There are no aggressive appearing lytic or blastic
lesions noted in the visualized portions of the skeleton.
IMPRESSION: 1. New small left pleural effusion layering dependently with some
passive atelectasis in the inferior segment of the lingula and
within the left lower lobe.
2. Mild to moderate centrilobular and paraseptal emphysema.
3. Moderate hiatal hernia.
4. Mild cardiomegaly.
5. Atherosclerosis, including left main and 3 vessel coronary artery
disease. Assessment for potential risk factor modification, dietary
therapy or pharmacologic therapy may be warranted, if clinically
indicated.
6. There are calcifications of the aortic valve. Echocardiographic
correlation for evaluation of potential valvular dysfunction may be
warranted if clinically indicated.
7. Low-attenuation of the intravascular compartment suggestive of
anemia.
8. 1.2 cm low-attenuation left adrenal nodule compatible with an
adenoma.
9. Colonic diverticulosis.

## 2014-06-06 ENCOUNTER — Ambulatory Visit: Payer: Medicare Other | Admitting: Neurology

## 2014-12-04 NOTE — Discharge Summary (Signed)
PATIENT NAME:  [name] RaoGRAVES, Kenneth MR#:  [id number] DATE OF BIRTH:  Dec 20, 1931  DATE OF ADMISSION:  03/09/2012 DATE OF DISCHARGE:  03/11/2012   ADMITTING DIAGNOSIS: Hypotension.   DISCHARGE DIAGNOSES:  1. Hypotension likely due to combination of antihypertensives, anemia, now her hypotension is resolved. Blood pressure is actually slightly elevated.  2. Acute on chronic renal failure, improved with IV hydration.  3. Anemia with recent diverticular bleed. Hemoglobin did drop due to her hypotension status post 1 unit of packed RBCs.  4. Myasthenia gravis, on prednisone therapy.  5. Shortness of breath and hypoxia, similar type of presentation during last hospitalization, felt to have a possible diastolic CHF. The patient will now be on O2, needs to be re-evaluated by primary MD to see if she still needs oxygen therapy.   6. Asthma.   CONSULTANT: Dr. Marva PandaSkulskie   PERTINENT LABS AND EVALUATIONS: Admitting glucose 99, BUN 35, creatinine 2.23, sodium 143, potassium 3.9, chloride 98, CO2 34, WBC count 11.8, hemoglobin 8.2, hematocrit 26.1, platelet count 193. The patient's creatinine on July 25th was 1.71. Hemoglobin dropped on July 23rd to 7.3 after IV hydration. Hemoglobin today is 8.4.   HOSPITAL COURSE: Please refer to history and physical done by the admitting physician. The patient is a 81 year old African American female who was recently hospitalized with lower GI bleed felt to be diverticular in nature. During that hospitalization she required transfusion. She also developed shortness of breath and was thought to have possible diastolic CHF. The patient was discharged home and she returned with hypotension because she was noted to have low blood pressure by home health nurse. The patient in the ED was noted to have 87/42 blood pressure. She was recently started on Lasix and Coreg. All of her antihypertensives were held. She was given IV fluids. With IV hydration her hemoglobin did drop. Likely her  initial hemoglobin was 8.2 and dropped down to 7.3. There was no evidence of active bleeding. It could be related to her being hemoconcentrated on presentation. The patient received 1 unit of packed RBCs and was given IV fluids with resolution of her hypotension. Her blood pressure today is actually in the 150's. The patient also because of low blood pressure and anemia had to receive 1 unit of packed RBCs. She has not seen any further bleeding. She was seen in consultation by GI who recommended just monitoring the patient. She also has a diagnosis of asthma/COPD and during her previous hospitalization also required oxygen. She was ambulated and noted to have low oxygen, therefore, is being discharged on home oxygen. At this time she is stable for discharge.   DISCHARGE INSTRUCTIONS: The patient is told to weigh herself every day and call physician if gains more than 2 pounds or 5 pounds in one week   DISCHARGE MEDICATIONS:  1. Tramadol 50 q.6 p.r.n.  2. Xopenex inhaled aerosol q.4 p.r.n. 3. Pyridostigmine 30 mg q.8 hours.  4. Carvedilol 12.5 1 tab p.o. b.i.d.  5. Prednisone 10 p.o. daily.  6. Iron sulfate 325 mg 1 tab p.o. b.i.d. with meals. 7. Protonix 40 daily.  8. MiraLAX 17 grams daily. 9. O2 via nasal cannula 2 liters.   DIET: Low sodium, high fiber, regular consistency.   ACTIVITY: As tolerated.   TIMEFRAME FOR FOLLOW-UP:  1. Follow-up in 1 to 2 weeks with Dr. Darreld McleanLinda Miles. At that time she is to have a hemoglobin checked next Monday in Dr. Marvis MoellerMiles' office to make sure that there is no trend in dropping  of her hemoglobin.   2. Follow-up with Dr. Marva Panda in 1 to 2 weeks.   TIME SPENT: 35 minutes.   ____________________________ Lacie Scotts Allena Katz, MD shp:drc D: 03/11/2012 10:36:11 ET T: 03/11/2012 12:30:44 ET JOB#: [id number]  cc: Berkeley Veldman H. Allena Katz, MD, <Dictator>, Leanna Sato, MD, Christena Deem, MD Charise Carwin MD ELECTRONICALLY SIGNED 03/15/2012 12:16

## 2014-12-04 NOTE — Discharge Summary (Signed)
PATIENT NAME:  [name] Duffy, [name] MR#:  [id number] DATE OF BIRTH:  [birth year]  DATE OF ADMISSION:  02/21/2012 DATE OF DISCHARGE:  03/01/2012  DISCHARGE DIAGNOSES:  1. Lower gastrointestinal bleed, diverticular in nature, resolved.  2. Acute renal failure secondary to gastrointestinal bleed and also a combination of Lasix and lisinopril, stable.  3. Acute asthma exacerbation.  4. Myasthenia gravis. 5. Arthritis.  6. Hypertension.  7. Diastolic heart failure.   DISCHARGE MEDICATIONS:  1. Lasix 20 mg three tablets once a day.  2. Hydralazine 25 mg p.o. three times daily. 3. MAPAP 325 mg two tablets every six hours as needed. 4. Tramadol 50 mg every six hours p.r.n. for arthritis. 5. Pyridostigmine 60 mg 1/2 tablet three times daily.  6. Xopenex inhaler two puffs every 4 hours.  NEW MEDICATIONS: 1. Coreg; she was taking 6.25 mg, we increased it to 12.5 mg p.o. twice a day. 2. Lisinopril 5 mg; the patient was on lisinopril 20 mg and we decreased to 5 mg. 3. Prednisone 40 mg daily for two days, 20 mg daily for two days, 30 mg daily for two days and then she can resume prednisone 10 mg daily after that. 4. DuoNebs every four hours p.r.n. for wheezing.   DIET: Low-residue diet for a week and resume low sodium diet after one week.  DISCHARGE FOLLOWUP: Followup with Dr. Marva PandaSkulskie in two months for flexible sigmoidoscopy and also possible polypectomy at that time. Followup with Dr. Adrian BlackwaterShaukat Khan in one week. Followup with primary doctor in two weeks.   CONSULTANTS:  1. Adrian BlackwaterShaukat Khan, MD - Cardiology. 2. Barnetta ChapelMartin Skulskie, MD - Gastroenterology. 3. Lurline DelShaukat Iftikhar, MD - Gastroenterology. 4. Physical Therapy.   CONDITION ON DISCHARGE: Stable.  PROCEDURES: Flexible sigmoidoscopy and colonoscopy. Flexible sigmoidoscopy was done on 02/26/2012 showing lower gastrointestinal bleeding of uncertain etiology. The patient also had multiple diverticula. Colonoscopy was done yesterday, that is 02/29/2012,  and showed multiple small large multi-diverticula in the entire colon and medium multi-diverticula in the entire colon. Clot noted in the diverticulum. No active bleed. Several small polyps are noted of the rectum which are 3 mm in size, not removed due to clinical situation. The patient needs flexible sigmoidoscopy and polypectomy in two months.    DIET: Low residue diet for one week and full liquids after that and after that resume regular diet.  DISCHARGE FOLLOWUP: Follow up with gastroenterology in one month.   HOSPITAL COURSE:  1. Gastrointestinal bleed. A 81 year old female with hypertension, Myasthenia gravis, hyperlipidemia, and arthritis admitted for GI bleed, dark red stool. The patient denies any use of Goody's Powder, no vomiting and no abdominal pain. The patient was admitted to the hospitalist service for GI bleed. The patient's hemoglobin was 10.8 on admission and INR was 0.9. She was started on IV PPIs along with IV fluids and Protonix drip. She was seen by gastroenterology and initially they thought it was a diverticular bleed which had resolved because the patient's hemoglobin stayed stable. They planned to have outpatient colonoscopy, but the patient persistently had small episodes of bleeding from the rectum and CAT scan of the abdomen showed exuberant diverticulosis in the ascending/descending colon and cannot exclude diverticulitis. The patient initially had a flexible sigmoidoscopy. At that time it showed proximal bleeding and they could not assess the cause for GI bleed on flexible sigmoidoscopy so she had a colonoscopy yesterday which showed the results as I mentioned with multiple diverticula with no active bleeding and some polyps in the descending colon. The patient  is scheduled to have a GI follow up as an outpatient. She did not have anymore bloody stools yesterday and her hemoglobin did stay stable around 8.8 and did not require any transfusion during the hospital stay. The  patient also had a bleeding scan done on 02/24/2012 which showed no evidence of GI bleeding on bleeding scan.  2. Diastolic heart failure. The patient started to have trouble breathing and some fluid overload signs. At that time her fluids were held and an echocardiogram was done which showed an ejection fraction of more than 55% and cardiology clearance was obtained before colonoscopy because of her shortness of breath and possible heart failure. Dr. Welton Flakes has seen the patient. Her Echo showed more than 55% with normal congestive heart failure. She was started on Lasix along with Coreg and lisinopril. She did receive IV Lasix to help her with fluids. Right now her trouble breathing is improved and she is saturating around 93% on 1 liter. The cardiologist has adjusted the medications, as described in the discharge dictation. 3. Hypertension. The patient's blood pressure on admission was elevated at 188/86 with heart rate 67. She was getting hydralazine at 50 mg. The  dose has been increased from 25 to 50 mg and continued on her home medications as well. Because she was getting IV Lasix and the Coreg dose was increased, we had to hold the hydralazine because blood pressure was running on the low side on 02/26/2012 and her hydralazine has been on hold since 02/22/2011. The patient's blood pressure has been doing good on lisinopril, Lasix, ACE inhibitor, and Coreg. At this time, I will stop the hydralazine and discontinue the hydralazine on discharge medications.  4. Acute renal failure. The patient was found to have a BUN of 35 and creatinine 1.48 on admission, thought to have a GI bleed and acute renal failure. However, her kidney function remained abnormal and most recent one on 02/27/2012 is showing BUN 26 and creatinine 1.56 with GFR of 37, so I recommended her to followup with her primary doctor in two weeks to repeat her blood work and follow up on kidney function and probably request nephrology evaluation.   5. Asthma exacerbation. The patient did have some wheezing and she did get better with DuoNebs and steroids so I started her on IV Solu-Medrol and we need to wean her off and we gave her a prescription for prednisone tapering and she also was given a prescription for DuoNebs.  6. Myasthenia gravis. She can continue her pyridostigmine and prednisone once she finishes the dose that I wrote for the prescription of prednisone.   She will follow-up with her primary doctor and follow with Dr. Marva Panda. She has an appointment with Dr. Marva Panda on 04/11/2012 and followup with Dr. Darreld Mclean on 03/09/2012 and Dr. Adrian Blackwater on 03/15/2012.        We are going to request physical therapy evaluation to make sure how much she can do before we discharge her and also check oxygen saturations on exertion to see if she needs any home oxygen.   TIME SPENT ON DISCHARGE PREPARATION: More than 30 minutes.     ____________________________ Katha Hamming, MD sk:slb D: 03/01/2012 11:08:24 ET T: 03/02/2012 09:55:20 ET JOB#: [id number]  cc: Katha Hamming, MD, <Dictator> Leanna Sato, MD Katha Hamming MD ELECTRONICALLY SIGNED 03/10/2012 23:12

## 2014-12-04 NOTE — Consult Note (Signed)
Chief Complaint:   Subjective/Chief Complaint no n/v or abdominal pain.  2 small bm today, dark bloody.   VITAL SIGNS/ANCILLARY NOTES: **Vital Signs.:   02-Oct-13 09:42   Vital Signs Type Q 4hr   Temperature Temperature (F) 98.1   Celsius 36.7   Temperature Source Oral   Pulse Pulse 51   Respirations Respirations 18   Systolic BP Systolic BP 683   Diastolic BP (mmHg) Diastolic BP (mmHg) 75   Mean BP 110   Pulse Ox % Pulse Ox % 100   Pulse Ox Activity Level  At rest   Oxygen Delivery 2L; Nasal Cannula  *Intake and Output.:   02-Oct-13 05:00   Stool  dark with red blood in watery stool    07:43   Stool  200cc dark brownish/red liquid stool    09:15   Stool  small amount of dark red bloody loose stool    12:45   Stool  small dark bloody  loose stool   Brief Assessment:   Cardiac Regular    Respiratory clear BS    Gastrointestinal details normal Soft  Nontender  Nondistended  No masses palpable  Bowel sounds normal   Lab Results:  Routine Chem:  02-Oct-13 05:38    Glucose, Serum 82   BUN 17   Creatinine (comp) 1.28   Sodium, Serum 143   Potassium, Serum 4.9   Chloride, Serum  109   CO2, Serum 31   Calcium (Total), Serum 9.2   Anion Gap  3   Osmolality (calc) 286   eGFR (African American)  46   eGFR (Non-African American)  40 (eGFR values <56m/min/1.73 m2 may be an indication of chronic kidney disease (CKD). Calculated eGFR is useful in patients with stable renal function. The eGFR calculation will not be reliable in acutely ill patients when serum creatinine is changing rapidly. It is not useful in  patients on dialysis. The eGFR calculation may not be applicable to patients at the low and high extremes of body sizes, pregnant women, and vegetarians.)  Routine Hem:  02-Oct-13 05:38    WBC (CBC) 6.4   RBC (CBC)  3.10   Hemoglobin (CBC)  9.0   Hematocrit (CBC)  27.1   Platelet Count (CBC) 168   MCV 87   MCH 29.1   MCHC 33.4   RDW  17.0   Neutrophil %  75.5   Lymphocyte % 16.7   Monocyte % 6.8   Eosinophil % 0.4   Basophil % 0.6   Neutrophil # 4.9   Lymphocyte # 1.1   Monocyte # 0.4   Eosinophil # 0.0   Basophil # 0.0 (Result(s) reported on 18 May 2012 at 0[facility])   Assessment/Plan:  Assessment/Plan:   Assessment 1) gi bleeding-broad differential.  upper versus lower.  H/O lower gi bleed in the past with diverticulum.  Note of vascular tongue lesion, question of HHT.   2) h/o myasthenia gravis, htn, asthma.    Plan 1) egd today, further recs to follow.   Electronic Signatures for Addendum Section:  SLoistine Simas(MD) (Signed Addendum 02-Oct-13 13:04)  I have discussed the risks benefits and complications of egd to include not limited to bleeding infection perforation and sedation and she wishes to proceed.   Electronic Signatures: SLoistine Simas(MD)  (Signed 02-Oct-13 13:03)  Authored: Chief Complaint, VITAL SIGNS/ANCILLARY NOTES, Brief Assessment, Lab Results, Assessment/Plan   Last Updated: 02-Oct-13 13:04 by SLoistine Simas(MD)

## 2014-12-04 NOTE — Consult Note (Signed)
Chief Complaint:   Subjective/Chief Complaint denies abdominal pain.  no evidence of GI bleeding. no nausea.   VITAL SIGNS/ANCILLARY NOTES: **Vital Signs.:   26-Jul-13 08:42   Vital Signs Type Routine   Temperature Temperature (F) 99   Celsius 37.2   Temperature Source oral   Pulse Pulse 64   Respirations Respirations 20   Systolic BP Systolic BP 152   Diastolic BP (mmHg) Diastolic BP (mmHg) 75   Mean BP 100   Pulse Ox % Pulse Ox % 95   Pulse Ox Activity Level  At rest   Oxygen Delivery 1L   Brief Assessment:   Cardiac Regular    Respiratory clear BS    Gastrointestinal details normal Soft  Nontender  Nondistended  No masses palpable  Bowel sounds normal   Lab Results:  Routine Hem:  22-Jul-13 19:43    Hemoglobin (CBC)  8.2  23-Jul-13 04:10    Hemoglobin (CBC)  7.3  24-Jul-13 05:41    Hemoglobin (CBC)  8.2 (Result(s) reported on 09 Mar 2012 at 06:36AM.)  25-Jul-13 05:09    Hemoglobin (CBC)  7.9  26-Jul-13 05:33    WBC (CBC) 7.7   RBC (CBC)  2.89   Hemoglobin (CBC)  8.4   Hematocrit (CBC)  24.9   Platelet Count (CBC) 210   MCV 86   MCH 29.2   MCHC 33.9   RDW  15.4   Neutrophil % 67.4   Lymphocyte % 18.3   Monocyte % 12.7   Eosinophil % 1.2   Basophil % 0.4   Neutrophil # 5.2   Lymphocyte # 1.4   Monocyte #  1.0   Eosinophil # 0.1   Basophil # 0.0 (Result(s) reported on 11 Mar 2012 at 06:16AM.)   Assessment/Plan:  Assessment/Plan:   Assessment 1) history of lower gi/diverticular bleeding.  Not recurrent, redmitted with anemia.  Hemoccult negative on admission. Denies evidence of bleeding after she went home railing possibility of volume related issure in light of elevate creat on readmission.  Denies any upper gi symptoms.    Plan 1) no plans for further inpatient GI evaluation.  Recommend GI follow up in 2 weeks or so.  will sign off.   Electronic Signatures: Barnetta ChapelSkulskie, Remigio Mcmillon (MD)  (Signed 26-Jul-13 11:04)  Authored: Chief Complaint, VITAL  SIGNS/ANCILLARY NOTES, Brief Assessment, Lab Results, Assessment/Plan   Last Updated: 26-Jul-13 11:04 by Barnetta ChapelSkulskie, Iceis Knab (MD)

## 2014-12-04 NOTE — Consult Note (Signed)
Brief Consult Note: Diagnosis: Anemia.  Recent admission for acute diverticular bleed.  Discharged one week ago from hospital.  Presented at this admission with hypotension.  Heme negative stool.   Consult note dictated.   Discussed with Attending MD.   Comments: Patient's presentation discussed with Dr. Barnetta ChapelMartin Skulskie.  Anemia felt to be in correlation with recent diverticular bleed.  No evidence of acute bleeding at this time.  Heme negative stool.  At the time of colonoscopy 02/29/2012 a colonic lesion was found and confirmed as the source of bleeding.  Believe that she is still not over this acute event.  Recommend limited diet and continued monitoring for any evidence of reoccurrence of bleeding.  Recommend outpatient GI evaluation at this time and no recommendation for further inpatient GI evaluation other than continued monitoring.  Electronic Signatures: Rodman KeyHarrison, Dawn S (NP)  (Signed 25-Jul-13 15:07)  Authored: Brief Consult Note   Last Updated: 25-Jul-13 15:07 by Rodman KeyHarrison, Dawn S (NP)

## 2014-12-04 NOTE — H&P (Signed)
PATIENT NAME:  [name] Duffy, [name] Duffy MR#:  [id number] DATE OF BIRTH:  Jan 11, 1932  DATE OF ADMISSION:  03/08/2012  REFERRING PHYSICIAN: Dr. Clemens Catholic  PRIMARY CARE PHYSICIAN: Dr. Darreld Mclean    CHIEF COMPLAINT: Hypertension.   HISTORY OF PRESENT ILLNESS: This is a 81 year old female with significant past medical history of hypertension, myasthenia gravis, asthma, chronic kidney disease at baseline, hypertension and diastolic heart failure who was recently discharged last week for episodes of lower gastrointestinal bleed diverticular in nature, patient was sent from home as she was noticed to have low blood pressure by visiting nurse, blood pressure of 87/42 today. Patient denies any accompanying symptoms. Denies any lightheadedness, any dizziness, any headaches, syncope, near syncope, chest pain, shortness of breath, cough, productive sputum, leg swelling, nausea, vomiting, vertigo. Upon presentation to ED patient had blood pressure 96/56, patient was recently discharged on increased dose of Coreg from 3.125 mg oral to 12.5 mg oral twice a day, as well she was discharged on 60 mg of oral Lasix and her lisinopril decreased from 20 to 5 mg Lasix as well. Patient reports she has not been taking good oral intake and drinking enough fluids over the last two days. As well patient's creatinine was noticed to be elevated from baseline around 1.5 to 2.1. Hospitalist service requested to admit the patient for further work-up of her hypotension and IV fluid hydration for her acute renal failure.   PAST MEDICAL HISTORY:  1. Hypertension.  2. Hyperlipidemia.  3. Myasthenia gravis diagnosed one year ago.  4. Arthritis.  5. History of asthma.  6. Recent diverticular GI bleed.   PAST SURGICAL HISTORY: Left cataract surgery.   ALLERGIES: Crestor, unknown reaction.    HOME MEDICATIONS:  1. Coreg 12.5 mg oral 2 times a day.  2. Lasix 60 mg daily.  3. Hydralazine 25 mg 3 times a day.  4. Lisinopril 20 mg daily.   5. Prednisone 10 mg daily.  6. Pyridostigmine 30 mg oral every eight hours.  7. Tramadol 50 mg every six hours as needed.  8. Xopenex 2 puffs inhalation every four hours as needed.   SOCIAL HISTORY: He quit tobacco 30 years ago. No alcohol or illicit drug use.    FAMILY HISTORY: Mother has history of breast cancer, sister had MI.   REVIEW OF SYSTEMS: CONSTITUTIONAL: Denies any fever, fatigue, weakness. EYES: Denies blurry vision, double vision or pain. ENT: Denies tinnitus, ear pain, hearing loss. RESPIRATORY: Denies cough, wheezing, hemoptysis. Has history of asthma. CARDIOVASCULAR: Denies chest pain, orthopnea, arrhythmia, palpitations. GASTROINTESTINAL: Denies nausea, vomiting, diarrhea, abdominal pain. No history of bright red blood per rectum, coffee-ground emesis or dark stools. GENITOURINARY: Denies any dysuria, hematuria, renal colic. ENDO: Denies polyuria, polydipsia, heat or cold intolerance. HEMATOLOGY: Denies any history of easy bruising. Has recent diverticular bleed. Has anemia. INTEGUMENTARY: Denies any acne or rash. MUSCULOSKELETAL: Denies neck pain, shoulder pain, back pain, gout. NEURO: Denies any numbness, weakness, dysarthria, epilepsy, tremors. PSYCHIATRIC: Denies anxiety, insomnia, schizophrenia or nervousness.   PHYSICAL EXAMINATION:  VITAL SIGNS: Temperature 99.1, pulse 53, respiratory rate 18, blood pressure 96/56 supine, saturating 100% on oxygen.   GENERAL: Elderly female, looks comfortable in bed in no apparent distress.   HEENT: Head atraumatic, normocephalic. Pupils equal, reactive to light. Pink conjunctivae. Anicteric sclerae. Moist oral mucosa.   NECK: Supple. No thyromegaly. No JVD.   CHEST: Good air entry bilaterally. No wheezing, rales, rhonchi.   CARDIOVASCULAR: S1, S2 heard. No rubs, murmur, gallops.   ABDOMEN: Soft, nontender, nondistended. Bowel  sounds present.   EXTREMITIES: Mild bilateral pitting edema around ankle. No clubbing. No cyanosis.    PSYCHIATRIC: Appropriate affect. Awake, alert x3. Intact judgment and insight. Motor examination grossly intact.    NEUROLOGIC: Motor 5/5 in all extremities. Intact symmetrical sensation.   LABORATORY, DIAGNOSTIC AND RADIOLOGICAL DATA: Glucose 99, BUN 35, creatinine 2.23, sodium 142, potassium 3.9, chloride 98, CO2 34, white blood cells 11.8, hemoglobin 8.2, hematocrit 26.1, platelets 193. Urinalysis negative.   EKG showing normal sinus rhythm with bradycardia with ventricular rate of 49%.   ASSESSMENT AND PLAN: This is a 81 year old female presents with hypotension and acute on chronic renal failure most likely due to dehydration and medication induced.  1. Hypotension. This is multifactorial, appears to be volume depletion from poor p.o. intake and Lasix as well as multiple blood pressure medication with recent increase on Coreg dose and this can be manifested by patient's bradycardia with her low heart rate so will start on IV fluid resuscitation, will hold Lasix, will hold Coreg, will hold lisinopril and hydralazine and these medications to be resumed at a lower dose, mainly the beta blocker secondary to bradycardia and Lasix prior to discharge. Patient will be monitored on tele.  2. Acute on chronic renal failure. Will hold lisinopril and Lasix and will continue with IV fluid resuscitation.  3. Anemia. Patient had recent diverticular bleed. Will avoid chemical anticoagulation. Will start on iron supplement.  4. Myasthenia gravis. Will continue on prednisone. Will start on Protonix for GI prophylaxis and will continue on pyridostigmine. Has no deficits.  5. History of asthma. Will continue on Xopenex p.r.n.  6. GI prophylaxis on Protonix. DVT prophylaxis on sequential compression device.  7. CODE STATUS: FULL CODE.       TOTAL TIME SPENT ON PATIENT CARE: 50 minutes.   ____________________________ Starleen Armsawood S. Nyashia Raney, MD dse:cms D: 03/07/2012 23:52:08 ET T: 03/08/2012 06:51:24  ET JOB#: [id number]  cc: Starleen Armsawood S. Benjerman Molinelli, MD, <Dictator> Leanna SatoLinda M. Miles, MD Clancy Leiner Teena IraniS Veda Arrellano MD ELECTRONICALLY SIGNED 03/17/2012 22:18

## 2014-12-04 NOTE — Consult Note (Signed)
Chief Complaint:   Subjective/Chief Complaint Patietn seen and examined.  Please see full GI consult/short consult note for recommendations.   VITAL SIGNS/ANCILLARY NOTES: **Vital Signs.:   25-Jul-13 15:30   Vital Signs Type Routine   Temperature Temperature (F) 98.6   Celsius 37   Temperature Source Oral   Pulse Pulse 62   Respirations Respirations 18   Systolic BP Systolic BP 944   Diastolic BP (mmHg) Diastolic BP (mmHg) 62   Mean BP 90   Pulse Ox % Pulse Ox % 97   Oxygen Delivery 1L   Brief Assessment:   Cardiac Irregular    Respiratory clear BS    Gastrointestinal details normal Soft  Nontender  Nondistended  No masses palpable  Bowel sounds normal    Additional Physical Exam DRE-hemoccult negative   Lab Results: Routine Chem:  25-Jul-13 05:09    Glucose, Serum 87   BUN  29   Creatinine (comp)  1.71   Sodium, Serum 142   Potassium, Serum 4.5   Chloride, Serum 106   CO2, Serum 32   Calcium (Total), Serum  8.4   Anion Gap  4   Osmolality (calc) 288   eGFR (African American)  33   eGFR (Non-African American)  28 (eGFR values <84m/min/1.73 m2 may be an indication of chronic kidney disease (CKD). Calculated eGFR is useful in patients with stable renal function. The eGFR calculation will not be reliable in acutely ill patients when serum creatinine is changing rapidly. It is not useful in  patients on dialysis. The eGFR calculation may not be applicable to patients at the low and high extremes of body sizes, pregnant women, and vegetarians.)  Routine Hem:  25-Jul-13 05:09    WBC (CBC) 8.3   RBC (CBC)  2.72   Hemoglobin (CBC)  7.9   Hematocrit (CBC)  23.6   Platelet Count (CBC) 192   MCV 87   MCH 28.9   MCHC 33.3   RDW  15.3   Neutrophil % 73.9   Lymphocyte % 12.9   Monocyte % 11.8   Eosinophil % 1.1   Basophil % 0.3   Neutrophil # 6.1   Lymphocyte # 1.1   Monocyte #  1.0   Eosinophil # 0.1   Basophil # 0.0 (Result(s) reported on 10 Mar 2012 at  06:11AM.)   Electronic Signatures: SLoistine Simas(MD)  (Signed 25-Jul-13 20:17)  Authored: Chief Complaint, VITAL SIGNS/ANCILLARY NOTES, Brief Assessment, Lab Results   Last Updated: 25-Jul-13 20:17 by SLoistine Simas(MD)

## 2014-12-04 NOTE — Consult Note (Signed)
PATIENT NAME:  [name] Duffy, [name] Duffy MR#:  [id number] DATE OF BIRTH:  [birth year]  DATE OF CONSULTATION:  03/10/2012  REFERRING PHYSICIAN:   CONSULTING PHYSICIAN: Barnetta Chapel, MD / Rodman Key, NP  PRIMARY CARE PHYSICIAN: Darreld Mclean, MD  CHIEF COMPLAINT: Hypotension.  REASON FOR CONSULTATION: Diverticular bleed and anemia.   HISTORY OF PRESENT ILLNESS: [name] Duffy is a 81 year old female who has a significant past medical history of hypertension, myasthenia gravis, asthma, chronic kidney disease, hypertension, and diastolic heart failure. She was recently admitted a couple of weeks ago and was just discharged a week ago for lower gastrointestinal bleed which was felt to be diverticular in nature. In fact she had had gastrointestinal evaluation performed by Dr. Barnetta Chapel during her hospitalization which was inclusive of a flexible sigmoidoscopy which was done on 02/26/2012. Findings were the scope was advanced about 35 cm from the anal verge. There was a mixture of old and some fresher-appearing red bloody effluent. Multiple diverticula were noted. The colon was torturous to the distal and the upper sigmoid. No evidence of active bleeding. The patient was prepped and a colonoscopy was done on 02/29/2012.  Findings were multiple small largemouth diverticula are found in the entire colon, a single medium amount diverticulum was found in the entire colon at 37 centimeters proximal to the anus, and a clot was noted in the diverticulum. No active bleeding. Several small polyps were noted in the upper rectal vault. These were less than 3 mm in size and were not removed at the time of her procedure given her acute event and recommended to be removed two months from that time.   The patient presented after being seen and evaluated by a home health nurse and found to have a blood pressure of 87/42. She had no accompanying symptoms, denies lightheadedness, any dizziness, headache, syncope, near syncope,  chest pain, shortness of breath, leg swelling, nausea, or vomiting. She has had no abdominal pain. Bowels have been moving on average twice a day. No evidence of bright red blood since being discharged or melena. She has not had a great appetite but this has improved over the last two days during hospitalization. In the emergency room, the patient's blood pressure was 96/56. Her Coreg had recently been increased from 3.125 mg to 12.5 mg twice a day. Lasix at the time of her discharge was 60 mg and lisinopril was decreased from 20 mg to 5 mg according to History and Physical note reviewed from admission.   PAST MEDICAL HISTORY:  1. Hypertension.  2. Hyperlipidemia.  3. Myasthenia gravis. 4. Arthritis. 5. Asthma. 6. Recent diverticular bleed.   PAST SURGICAL HISTORY: Left cataract surgery.   ALLERGIES: Crestor.   HOME MEDICATIONS:  1. Coreg 12.5 mg twice daily. 2. Lasix 60 mg daily. 3. Hydralazine 25 mg three times daily. 4. Lisinopril 20 mg a day. 5. Prednisone 10 mg a day. 6. Pyridostigmine 30 mg orally every eight hours. 7. Tramadol 50 mg every six hours as needed. 8. Xopenex 2 puff inhalation every four hours as needed.   SOCIAL HISTORY: Remote tobacco use, quit 30 years ago. No alcohol. No recreational drug use.   FAMILY HISTORY: No history of colon cancer. Mother history of breast cancer. Sister with myocardial infarction.   REVIEW OF SYSTEMS: CONSTITUTIONAL: Denies any fevers. Significant for fatigue, weakness. EYES: No blurred vision, double vision, or pain. Wears glasses. ENT: No tinnitus, ear pain, or hearing loss. RESPIRATORY: No coughing, no wheezing, hemoptysis, history of asthma. CARDIOVASCULAR: No  chest pain. No orthopnea. No arrhythmia, palpitations. GI: See history of present illness. GU: Denies dysuria, hematuria, or renal calculi. ENDOCRINE: No polyuria, polydipsia, or heat or cold intolerance. HEMATOLOGY: Denies any history of easy bruising. Significant for recent  diverticular bleed. History of anemia. INTEGUMENT: No rashes. No acne, lesions. MUSCULOSKELETAL: No neuralgias or myalgias. PSYCH: No depression. No anxiety.    PHYSICAL EXAMINATION:   VITAL SIGNS: Temperature 98.3 with a pulse of 67, respirations 20, blood pressure 143/65, and  pulse oximetry 97% on room air.   GENERAL: Well-developed, well nourished 81 year old African American female in no acute distress noted, pleasant, resting comfortably in bed, sister at bedside.   HEENT: Normocephalic, atraumatic. Pupils are equally round and reactive to light. Conjunctivae clear. Sclerae anicteric.   NECK: Supple. Trachea midline. No lymphadenopathy or thyromegaly.   PULMONARY: Symmetric rise and fall of chest. Clear to auscultation throughout.   CARDIOVASCULAR: Regular rhythm, S1 and S2. No murmurs. No gallops.   ABDOMEN: Soft, nondistended. Bowel sounds in all four quadrants. No bruits. No masses.   RECTAL: Exam performed by Dr. Barnetta ChapelMartin Skulskie. According to verbal report, heme-negative stool.   EXTREMITIES: No edema.   MUSCULOSKELETAL: No contractures. No clubbing.   PSYCHIATRIC: Alert and oriented x4. Memory grossly intact. Appropriate affect and mood.   NEUROLOGIC: No gross neurological deficits.   LABORATORY, DIAGNOSTIC AND RADIOLOGIC DATA: Chemistry panel on admission: BUN 35 and creatinine 2.23. Anion gap was elevated at 34. Comparison, today's date, BUN has improved to 29 but remains elevated, creatinine 1.71, anion gap low at 4, and calcium has declined from admission at 8.9 to 8.4. Hepatic panel: Total protein 5.3, albumin 2.5, and alkaline phosphatase 41.   CBC on admission: WBC count was 11.8, RBC was 2.99, hemoglobin was 8.2, and hematocrit 26.1. The patient was discharged with a hemoglobin of 8.2 from a prior admission. Hemoglobin has declined to 7.3, 8.2, and 7.9 today.   Antibody screen is negative. Type and crossmatch for 2 units was done. ABO group with Rh type is  O-positive.   Urinalysis: +3 blood, mucous present.   EKG: Marked sinus bradycardia.   Chest x-ray revealed mild cardiomegaly, atherosclerotic disease and chronic obstructive pulmonary disease.   IMPRESSION:  1. Anemia.  2. Recent admission for acute diverticular bleed, discharged one week ago from the hospital. Presenting this admission with hypotension and heme-negative stool.   PLAN: The patient's presentation was discussed with Dr. Barnetta ChapelMartin Skulskie. Anemia is felt to be in correlation with recent diverticular bleed. No evidence of acute bleeding at this time. Heme-negative stool. At the time of colonoscopy on 02/29/2012, a colonic lesion was found and confirmed as the source of bleeding. Believe that she is still not over this acute event. Recommend limited diet, continue monitoring for any evidence of recurrence of bleeding, and recommend outpatient GI evaluation at this time and no recommendation for further gastrointestinal evaluation during her hospitalization, but will continue to monitor the patient's status.  These services provided by Rodman Keyawn S. Kiyon Fidalgo, MS, APRN, [facility], FNP under collaborative agreement with Dr. Barnetta ChapelMartin Skulskie. ____________________________ Rodman Keyawn S. Wyndell Cardiff, NP dsh:slb D: 03/10/2012 15:18:25 ET     T: 03/10/2012 16:07:47 ET        JOB#: [id number] cc: Rodman Keyawn S. Timathy Newberry, NP, <Dictator> Rodman KeyAWN S Tashiana Lamarca MD ELECTRONICALLY SIGNED 03/11/2012 16:36

## 2014-12-04 NOTE — Discharge Summary (Signed)
PATIENT NAME:  [name] Duffy, [name] Duffy MR#:  [id number] DATE OF BIRTH:  02-28-1932  DATE OF ADMISSION:  05/18/2012 DATE OF DISCHARGE:  05/19/2012  PRIMARY CARE PHYSICIAN:  Dr. Darreld McleanLinda Miles  FINAL DIAGNOSES: 1. Gastrointestinal bleeding.  2. Anemia.  3. Diverticulosis.  4. Myasthenia gravis. 5. Hypertension. 6. Asthma.   PROCEDURE: EGD   CODE STATUS: FULL CODE.   HOME MEDICATIONS:  1. Tramadol 50 mg p.o. q. 6 h. p.r.n.  2. Coreg 12.5 mg p.o. b.i.d.  3. Prednisone 10 mg p.o. daily.  4. Ferrous sulfate 325 mg p.o. b.i.d. with meals. 5. Pyridostigmine 60-mg tablet, 0.5 tablet 4 times daily p.r.n.  6. Levalbuterol HFA 45 mcg INH aerosol, 15 grams, 2 puffs q. 4 hours p.r.n.  7. Omeprazole 20 mg p.o. daily.  8. Colace 100 mg p.o. daily.   DIET: Low sodium diet.   ACTIVITY: As tolerated.   FOLLOW-UP CARE: Follow up with PCP within 1 to 2 weeks. Follow up with Dr. Marva PandaSkulskie within one week.   REASON FOR ADMISSION: Lower GI bleeding.   HOSPITAL COURSE: The patient is a 81 year old female with a history of diverticulitis, colon polyps, and myasthenia gravis who came to the hospital due to lower gastrointestinal bleeding. Actually, the patient was sent by Dr. Marva PandaSkulskie for admission due to bloody effluent on luminal evaluation. For detailed history and physical examination, please refer to the admission note dictated by Dr. Mordecai MaesSanchez.  The patient came to the hospital to have a colonoscopy by Dr. Marva PandaSkulskie, which showed a significant amount of blood  around the colon, for which Dr. Marva PandaSkulskie was not able to see anything. He wanted the patient to be admitted to have a full colonoscopy. The patient's hemoglobin was 9.2, which was actually much better than before. Dr. Marva PandaSkulskie did an EGD yesterday, which showed no evidence of active or recent gastrointestinal bleeding. He recommended if there is a significant repeat episode of bleeding to repeat a bleeding scan and if positive then obtain a consult from  vascular surgery for possible microembolization. If there is no further bleeding area he will consider capsule endoscopy as an outpatient. After endoscopy the patient has no complaints. No active bleeding, melena, or bloody stool. The patient's hemoglobin is stable at 8.9 today. Her vital signs are stable.  Physical examination is unremarkable. The patient will be discharged to home today. I discussed the patient's discharge plan with the patient, nurse, and case manager.   TIME SPENT: About 38 minutes   ____________________________ Shaune PollackQing Bama Hanselman, MD qc:bjt D: 05/19/2012 12:48:05 ET T: 05/19/2012 15:03:45 ET JOB#: [id number]  cc: Shaune PollackQing Adham Johnson, MD, <Dictator> Leanna SatoLinda M. Miles, MD Shaune PollackQING Anton Cheramie MD ELECTRONICALLY SIGNED 05/20/2012 14:02

## 2014-12-04 NOTE — Consult Note (Signed)
Brief Consult Note: Diagnosis: melena.   Patient was seen by consultant.   Recommend further assessment or treatment.   Orders entered.   Discussed with Attending MD.   Comments: Patietn seen and examined. Patient came to endoscopy this am to have a flexible sigmoidoscopy to remove previously seen small polyps.  History of lower GI bleed associated with diverticulosis.  Apparently patient had started some bleeding during the prep for the proceedure last night, but did not relay that to me this am.  On rectal exam prior to proceedure, melena noted.  Scope introduced into the rectum with finding of blood throughout the colon.  I was unable to clear the lumen adequately to allow passage of the scope abouve the distal sigmoid ar to find a bleeding site.  Patietn admitted for further revaluation.  Hemodynamically stable, it is possible that the bleeding has stopped and the residual effluent is clearing.  GI bleeding scan noted to be apparently negative, some takeup of radionuclide in the stomach, possible only gastric takeup.  Recommend serial hemoglobin, transfuse as needed.  WIll allow non carbonated, no red clears.  It may be necessary to do EGD tomorrod to rule out upper GI bleeding lesion.  Of note, Ms [name] has a small vascular lesion near the tip of the tongue on physical exam, raising the possibility of this being a signal lesion for HHT (hereditary hemorrhagic teleangiectasia), whic woudl caulse similar lesions throughout the small intestine and stomach.  On Her previous admission she had a diverticular pocket with clot that was probable the site of the the previous diverticular bleeding.  Full consult to follow.  Electronic Signatures: Barnetta ChapelSkulskie, Hassan Blackshire (MD)  (Signed 01-Oct-13 17:07)  Authored: Brief Consult Note   Last Updated: 01-Oct-13 17:07 by Barnetta ChapelSkulskie, Shereen Marton (MD)

## 2014-12-04 NOTE — Consult Note (Signed)
PATIENT NAME:  [name] Duffy, [name] Duffy MR#:  [id number] DATE OF BIRTH:  09-19-1931  DATE OF CONSULTATION:  05/17/2012  REFERRING PHYSICIAN:   Dr. Berlinda Last CONSULTING PHYSICIAN:  Christena Deem, MD  REASON FOR CONSULTATION: GI bleed.   HISTORY OF PRESENT ILLNESS: [name] Duffy is a 81 year old African American female with whom I am familiar. I have seen her in the past in regards to recurrent lower GI bleed. She had a complete colonoscopy in evaluation of some hematochezia on 02/29/2012. At that time she was found to have multiple small large-mouth diverticula throughout the entire colon. There was, however, a single medium-mouth diverticulum found at about 37 cm proximal to the anal verge. There was a clot noted in this diverticulum and it appeared to be perhaps the involved site.  There was no active bleeding at that time. There were several small polyps that were noted in the upper rectal vault at that time. However, due to the clinical situation, these were not removed. She had been scheduled to come in for a flexible sigmoidoscopy today for removal of these lesions. She had apparently called the on-call physician last night stating that she had noticed some rectal bleeding that started during the middle of her prep. It was felt that this could be diverticular. When I saw her today before the procedure I had actually asked her whether she had seen any rectal bleeding and she said no. However, on rectal examination with digital rectal examination it was obvious there was some blood present, and when I placed the scope into the rectal vault there was what appeared to be melena. This appeared to be older bleeding, perhaps from last night, not fresh bleeding ongoing. I did try to advance the scope more proximally to see if I could find the location. However, due to the poor prep/unprepped situation, I was unable to advance beyond about 25 to 30 cm. She did have multiple diverticula. I did not find any  particular lesion that was bleeding. Subsequently I contacted the hospitalist for assistance in admitting the patient and further evaluation.   PAST MEDICAL HISTORY:  History of diverticulitis, colon polyps, myasthenia gravis, hypertension, lower GI bleed, recurrent, as noted above, history of asthma, history of cataract surgery.   OUTPATIENT MEDICATIONS:  1. Pyridostigmine bromide 60 mg, 1/2 tablet 3 times a day. 2. Carvedilol 12.5 mg twice a day.  3. Pantoprazole 40 mg a day.  4. Ferrous gluconate 325 mg once a day.   ALLERGIES: She is allergic to Crestor.   SOCIAL HISTORY: She lives with her niece. She does not use alcohol. She does not smoke. She quit tobacco use about 30 years ago. She has had a blood transfusion in the past. There are no illicits.   GI FAMILY HISTORY: Negative for colorectal cancer, liver disease, peptic ulcer disease, or colon polyps.   REVIEW OF SYSTEMS: 10 systems reviewed, negative with the exception of above.   PHYSICAL EXAMINATION:  VITAL SIGNS: Temperature 98, pulse 48, respirations 18, blood pressure 170/76, pulse oximetry 99%.   GENERAL: She is a 81 year old African American female in no acute distress.   HEAD: Normocephalic, atraumatic.   EYES: Anicteric.   NOSE: Septum midline. No lesions.   OROPHARYNX: No lesions. However, it is of note that there is a small telangiectasia toward the tip of the upper surface of the tongue. This is nonbleeding. The patient states she has had this for a long time.   NECK: Supple. No JVD. No lymphadenopathy. No thyromegaly.  HEART: Regular rate and rhythm.   LUNGS: Clear.   ABDOMEN: Soft, nontender, nondistended. Bowel sounds positive, normoactive.   RECTAL: Anorectal as above.   EXTREMITIES: No clubbing, cyanosis, or edema.   NEUROLOGICAL: Cranial nerves II through XII grossly intact. Muscle strength bilaterally equal and symmetric, 5/5. Deep tendon reflexes bilaterally equal and symmetric.   LABORATORY,  DIAGNOSTIC, AND RADIOLOGICAL DATA:   She had a metabolic panel showing a BUN of 22, creatinine 1.3, sodium 145, potassium 4.8, chloride 109, bicarbonate 28, osmolality 291, calcium 9.3. Hemogram showing white count of 6.8, hemoglobin/hematocrit of 9.2/27.4, platelet count 161, MCV 88, RDW 17.2. She had a proTime of 13.1, INR 1. She did have a bleeding scan after she was hospitalized, this showing no findings suspicious for active gastrointestinal bleeding. However, a persistent, non-migrating focus of activity in left upper quadrant of the abdomen compatible with free pertechnetate in the stomach or activity in the spleen.   ASSESSMENT:  Gastrointestinal bleed:  Question of possible recurrent diverticular bleeding versus other gastrointestinal blood loss source. It is of note, the patient has a vascular lesion on her tongue as a possible sentinel/signal lesion for hereditary hemorrhagic telangiectasias. If so there is a possibility that she may have AVMs throughout the small intestine and/or the stomach.   RECOMMENDATIONS:  1. Agree with admission and observation overnight. Serial hemoglobins. Transfuse as needed.  2. We will try to arrange for EGD tomorrow depending on clinical situation. I have discussed the risks, benefits, and complications of EGD to include but not limited to bleeding, infection, perforation, and the risks of sedation, and she wishes to proceed. We will allow her some clear liquids, noncarbonated, no red tonight.  3. Should the above study be uninformative, the likelihood that this is a recurrent diverticular bleed is interesting; however, I would recommend a video capsule endoscopy of the small intestine after a small bowel series. This can either be done inpatient or outpatient. We will follow with you. Thank you for your assistance in admission.  ____________________________ Christena DeemMartin U. Maurianna Benard, MD mus:bjt D: 05/17/2012 17:17:18 ET T: 05/17/2012 18:51:05  ET JOB#: [id number]  cc: Christena DeemMartin U. Delorise Hunkele, MD, <Dictator> Christena DeemMARTIN U Raymont Andreoni MD ELECTRONICALLY SIGNED 06/16/2012 11:20

## 2014-12-04 NOTE — Consult Note (Signed)
Chief Complaint:   Subjective/Chief Complaint Please see EGD report.  No evidence of active or recent GI bleeding.   Recommend continued observation.  If there is a significant repeat bleeding episode, repeat bleeding scan, and if positive then obtain consult from vascular surgery for possible microembolization.  If there is no further bleeding will consider  a video capsue endoscopy as o/p.   continue serial hgb, transfuse as needed.   VITAL SIGNS/ANCILLARY NOTES: **Vital Signs.:   02-Oct-13 09:42   Vital Signs Type Q 4hr   Temperature Temperature (F) 98.1   Celsius 36.7   Temperature Source Oral   Pulse Pulse 51   Respirations Respirations 18   Systolic BP Systolic BP 182   Diastolic BP (mmHg) Diastolic BP (mmHg) 75   Mean BP 110   Pulse Ox % Pulse Ox % 100   Pulse Ox Activity Level  At rest   Oxygen Delivery 2L; Nasal Cannula   Electronic Signatures: Barnetta ChapelSkulskie, Martin (MD)  (Signed 02-Oct-13 13:47)  Authored: Chief Complaint, VITAL SIGNS/ANCILLARY NOTES   Last Updated: 02-Oct-13 13:47 by Barnetta ChapelSkulskie, Martin (MD)

## 2014-12-04 NOTE — H&P (Signed)
PATIENT NAME:  [name] Duffy, [name] Duffy MR#:  [id number] DATE OF BIRTH:  1932-08-04  DATE OF ADMISSION:  05/17/2012  PRIMARY CARE PHYSICIAN: Darreld Mclean, MD  REASON FOR ADMISSION: Lower gastrointestinal bleeding.   CONSULTANTS:  1. Barnetta Chapel, MD - Gastroenterology.  2. Marcina Millard, MD - Cardiology.  HISTORY OF PRESENT ILLNESS: [name] Duffy is a very nice 81 year old female who has history of myasthenia gravis, diverticulitis, and colonic polyps. She has been hospitalized before back in July due to rectal bleeding which was diverticular in nature at the moment. The patient had a flexible sigmoidoscopy at that moment followed by full colonoscopy and it was found that she had some polyps on the rectal area. At that moment, it was felt that there was need to remove the polyps due to some correlated blood and some risk of bleeding for what she was scheduled to have a follow-up procedure. She has been previously admitted due to dehydration and prerenal acute kidney failure. The patient was given some blood and discharged on 03/11/2012. At that moment, the diagnosis was hypotension due to the combination of medications that she was taking, hypertensive medications, and she had acute on chronic renal failure, which resolved with IV hydration. She had some anemia due to the recent bleeding prior to that hospitalization and she received 1 unit of packed red blood cells. She has been recently diagnosed with diastolic congestive heart failure and shortness of breath and hypoxemia for what the patient is chronically on 2 liters of oxygen. The patient was actually seen by Dr. Darrold Junker as an outpatient and he determined that her systolic left ventricular function was normal and that the patient was on mostly beginnings of diastolic dysfunction. She has been recently started on Lasix and Coreg after all blood pressure medications were stopped. She has not had any major problems with her health lately and  she states that the medications have been working okay, although her heart rate right now is in the 40s to 50s likely due to Coreg. She states that she has been having increased fatigue and increased shortness of breath with activity in the past but lately those issues have been stable. She came today to have her scope done and Dr. Marva Panda decided to admit her and get her to have a colonoscopy tomorrow due to bloody effluent and luminal evaluation did not allow to see anything else and he states that the bleeding might not be active but it is significant enough that he cannot do the procedure at this moment.   REVIEW OF SYSTEMS: CONSTITUTIONAL: The patient denies any significant weight loss or weight gain. The patient states that she is weak and chronically fatigued likely due to her myasthenia gravis. HEENT: The patient denies any changes in her vision or any changes in her hearing recently. No eye pain. No erythema around the eyes. Denies any difficulty swallowing. Denies any neck pain or neck masses. CARDIOVASCULAR: Denies any chest pain, shortness of breath, orthopnea, persistent paroxysmal nocturnal dyspnea, or palpitations. RESPIRATORY: Denies any significant cough or sputum. The patient says she has had pneumonia in the past but it has been several years. GASTROINTESTINAL: Denies any abdominal pain, constipation, or diarrhea. Positive for GI bleeding, as mentioned above. GENITOURINARY: Denies any polyuria. The patient denies any dysuria or increased frequency. ENDOCRINE:  No polyuria, polydipsia, or polyphagia. No cold or heat intolerance. No thyroid problems. MUSCULOSKELETAL: Denies any significant joint effusions or joint pain. SKIN: Denies any significant rashes or changes of the shape or  size of moles. PSYCHOLOGIC: Denies any depression or anxiety. NEUROLOGIC: Denies any headaches CVAs, paresthesias, or lateral weakness on any side of her body. LYMPHATIC: Denies any lymphadenopathy or masses.   PAST  MEDICAL HISTORY:  1. Hypertension.  2. Myasthenia gravis.  3. Dyslipidemia.  4. Osteoarthritis.  5. History of asthma.  6. Diverticulosis. 7. Lower gastrointestinal bleeding.  8. Diastolic dysfunction.  9. Oxygen dependence.  10. Chronic kidney disease.   PAST SURGICAL HISTORY: Positive for cataract in the left eye. Denies any other procedures other than endoscopies.   ALLERGIES: The patient is allergic to Crestor. She says it makes her very dizzy and gives her a lot of nausea.   MEDICATIONS: 1. Xopenex. 2. Tramadol 50 mg every six hours p.r.n.  3. Pyridostigmine 30 mg every 8 hours.  4. Coreg 12.5 mg twice daily. 5. Previously the patient was on Lasix, hydralazine, and lisinopril. Those medications have been held. 6. Prednisone 10 mg a day.   SOCIAL HISTORY: The patient used to smoke, but she quit about 30 years ago. She used to smoke a pack a day for over 30 years. She denies any alcohol or IV drug use. She lives by herself and has a friend who helps take care of her.   FAMILY HISTORY: Positive for breast cancer in her mother and a history of breast cancer and myocardial infarction in a sister. Negative for colorectal cancer or liver disease.  PHYSICAL EXAMINATION:   VITAL SIGNS: Blood pressure 162/60, pulse 48, respiratory rate 20, temperature 98, and saturation 100% on 2 liters of oxygen.   GENERAL: The patient is comfortable, very pleasant, alert and oriented x3, in no acute distress. No respiratory distress and lying down on the stretcher without any distress at all.   HEENT: Normocephalic and atraumatic. Pupils are equal and reactive. Extraocular movements are intact. Mucosa moist. Conjunctivae pink. Sclerae anicteric. No oral lesions. No oropharyngeal exudates.   NECK: Supple. No JVD. No thyromegaly. No adenopathy. Trachea is central. Regular range of motion. No palpable masses. No carotid bruits are auscultated.   PULMONARY: Bilateral air entry is normal. Good  expansion. No wheezing. No rales. No dullness to percussion.   CARDIOVASCULAR: Regular rate and rhythm. No murmurs, rubs, or gallops. Apical heart tones, nondisplaced.   ABDOMEN: Soft, nontender, and nondistended. No hepatosplenomegaly. No masses. Bowel sounds are positive. No rebound.   GENITAL: Negative for external lesions.   EXTREMITIES: No edema, no cyanosis, and no clubbing at this moment. Sensation within normal limits.   NEUROLOGIC: Cranial nerves II through XII are intact. Strength is five out of five in all four extremities. Pulses +2. Deep tendon reflexes +2.   LYMPHATIC: Negative for lymphadenopathy in supraclavicular area or axilla.   SKIN: No petechiae or rashes.   LABORATORY DATA: Hemoglobin is 9.2, hematocrit 27, white cells 6.8, and platelets 161. Other labs are still pending. Her creatinine has been elevated with a baseline around 1.4. At discharge last time it was 1.7.  ASSESSMENT AND PLAN: This is a very nice 81 year old female with history of myasthenia gravis recently diagnosed with diastolic dysfunction and oxygen dependent, also history of hypertension and dyslipidemia. The patient comes with history of rectal bleeding and she is status post flexible sigmoidoscopy that showed significant amounts of blood for what she needs to have a full colonoscopy tomorrow.  1. Lower gastrointestinal bleeding. The patient has a history of GI bleeding since July. At that moment, she had a flexible sigmoidoscopy followed by colonoscopy. It was found  the patient had several small polyps that did not look suspicious and due to the fact that there was some coagulated blood and gastrointestinal bleeding that resolved, Dr. Marva PandaSkulskie decided to just reschedule the procedure and do it later. The patient came in today to have that procedure done and it was found that she had significant amount of blood around the colon for what Dr. Marva PandaSkulskie was not able to see anything. He wanted her to be admitted  to have a full colonoscopy tomorrow with a better prep and also she will be having red blood cell or bleeding tag scan which has been ordered. The patient is hemodynamically stable. She is actually hypertensive with some bradycardia. No signs of active gastrointestinal bleeding for what we are just going to give her some baseline IV fluids, keep her n.p.o. for the procedure and follow-up labs in the morning. I am going to hold her iron right now and I am going to let her take the rest of her medications. Her hemoglobin just came back at 9.2, which is actually much better than it was before. At discharge on 03/11/2012 it was 8.4 and at that moment she had transfusion of blood.  2. Hypertension. The patient has history of chronic elevated high blood pressure. She has been on treatment with lisinopril, furosemide, Coreg, and hydralazine in the past, but on her last admission she presented over here being really hypotensive requiring transfusion of blood and IV fluids and at that moment she was in acute kidney failure. For that reason, her medications have been changed and at this moment she is only taking Coreg 12.5 mg a day. She is bradycardic with a heart rate in the 40s to 50s, but has been very well tolerated for what I am not going to change her dose at this moment, but we are going to monitor closely. The patient has hydralazine ordered as p.r.n. for systolics above 160.  3. Bradycardia, likely due to Coreg. We will continue to watch and the patient has been so far asymptomatic. She is very active at home and does not get dizzy or lightheaded when she walks and no orthostatic hypotension noticed.  4. Anemia due to gastrointestinal bleeding, chronic. At this moment, it is a little bit better at 9.2. We holding the iron due to colonoscopy.  5. Myasthenia gravis. It has been well treated with pyridostigmine. We are going to continue this medication. The patient has been on steroids as well.  6. Chronic kidney  disease, baseline around 1.4. We will follow up labs.  7. Dyslipidemia. The patient is not currently on medication due to side effects. Followup as an outpatient.  8. Diastolic dysfunction. The patient is actually well compensated. At this moment she is at her baseline oxygen needs, which is 2 L/min. We will continue to observe and try not to fluid overload it for what IV fluids are going to be running at 60 mL/h.  9. History of asthma. Continue Xopenex.  10. Deep vein thrombosis prophylaxis. At this time, the patient is going to have only mechanical prophylaxis with TED hose and SCDs.  11. GI prophylaxis. Continue Protonix.   CODE STATUS: FULL CODE.   TIME SPENT: 48 minutes. ____________________________ Felipa Furnaceoberto Sanchez Gutierrez, MD rsg:slb D: 05/17/2012 13:25:00 ET T: 05/17/2012 15:18:52 ET JOB#: [id number]  cc: Leanna SatoLinda M. Miles, MD Felipa Furnaceoberto Sanchez Gutierrez, MD, <Dictator> Dorraine Ellender Juanda ChanceSANCHEZ GUTIERRE MD ELECTRONICALLY SIGNED 05/18/2012 19:03

## 2014-12-07 NOTE — H&P (Signed)
PATIENT NAME:  [name] Duffy, Sansa MR#:  [id number] DATE OF BIRTH:  01/21/1932  DATE OF ADMISSION:  05/09/2013  PRIMARY CARE PHYSICIAN: Dr. Darreld McleanLinda Miles.   PRIMARY [facility]PHROLOGIST: At [facility].   PRIMARY CARDIOLOGIST: At [facility].   CHIEF COMPLAINT: Chest pressure.   HISTORY OF PRESENT ILLNESS: The patient is a pleasant 81 year old female with history of myasthenia gravis, history of multiple bleeding episodes from diverticular disease, CKD and diastolic CHF, who presents with acute-onset chest pressure without any chest pain, without radiation, shortness of breath or dizziness. This has resolved since then. This happened about 8:30 or so. She did have some blurry vision, but that is better as well. She came into the hospital and she has had an EKG showing no significant changes from previous EKG and has a negative troponin. Hospitalist services were contacted for further evaluation and management.   PAST MEDICAL HISTORY:  1.  Myasthenia gravis. 2.  Diverticulosis with multiple admissions for diverticular bleed.  3.  History of diverticulitis. 4.  Hyperlipidemia.  5.  History of hypertension.  6.  History of asthma. 7.  History of diastolic CHF.  8.  History of chronic kidney disease.  9.  History of moderate aortic regurgitation.  10.  Pulmonary hypertension.   ALLERGIES: CRESTOR CAUSING DIZZINESS AND NAUSEA.   PAST SURGICAL HISTORY: Cataract surgery. Denies any other surgeries.   FAMILY HISTORY: Positive for breast cancer and CAD in sister. Mom also with breast cancer.   SOCIAL HISTORY: Ex-smoker. No alcohol or drug use. Lives by herself. Walks with a rolling walker.   OUTPATIENT MEDICATIONS: Amiodarone 200 mg daily, amlodipine 5 mg daily, docusate sodium 100 mg 2 times a day as needed, ferrous sulfate 325 mg 1 tab 2 times a day, furosemide 20 mg once a day as needed for shortness of breath or swelling, hydralazine 100 mg 2 times a day, levalbuterol CFC-free 2 puffs every 4 hours as needed for  shortness of breath, Mapap 325 mg 2 tabs every 6 hours as needed for pain, omeprazole 20 mg daily, prednisone 10 mg daily, pyridostigmine 60 mg 1/2 tab 4 times a day, tramadol 50 mg every 6 hours as needed for pain.  REVIEW OF SYSTEMS:   CONSTITUTIONAL: No weight changes, fever, chills or weakness.  EYES: Denies blurry vision currently, had some blurry vision earlier today. Had history of cataracts.  EARS, NOSE, THROAT: Denies tinnitus, hearing loss or runny nose.  RESPIRATORY: Denies cough, shortness of breath, wheezing.  CARDIOVASCULAR: No chest pain, but did have pressure as above. No palpitations. No swelling in the legs. No shortness of breath while lying flat.  GASTROINTESTINAL: History of diverticulosis and diverticulitis. Has dark stools, but she states she takes iron and that is chronic. No bright blood per rectum or abdominal pain.  GENITOURINARY: Denies dysuria or hematuria. HEMATOLOGIC AND LYMPHATIC: Denies anemia or easy bruising.  SKIN: No rashes.  MUSCULOSKELETAL: Has some chronic arthritis.  NEUROLOGIC: No focal weakness or numbness.  PSYCHIATRIC: Denies anxiety or insomnia.   PHYSICAL EXAMINATION: VITAL SIGNS: Temperature on arrival was noted to be 97.9, pulse rate 70, respiratory rate 18, blood pressure 162/72. O2 sat was 96% on room air.  GENERAL: The patient is a well-developed female lying in bed, no obvious distress.  HEENT: Normocephalic, atraumatic. Pupils are equal and reactive. Anicteric sclerae. Extraocular muscles intact. Moist mucous membranes.  NECK: Supple. No JVD.  CARDIOVASCULAR: S1, S2, irregularly irregular. The patient does have a murmur in the  aortic region, III/VI.  LUNGS: Clear to  auscultation without wheezing, rhonchi or rales.  ABDOMEN: Soft, nontender, nondistended. Hyperactive bowel sounds in all quadrants.  EXTREMITIES: With 1+ pitting edema to a little above the ankle. NEUROLOGIC: Cranial nerves II through XII grossly intact. Strength is 5 out of  5 all extremities. Sensation is intact to light touch.  PSYCHIATRIC: Awake, alert, oriented x 3. Pleasant, cooperative.   LABORATORY, DIAGNOSTIC AND RADIOLOGICAL DATA: WBC 8, hemoglobin 11.4, platelets 231. Hemoglobin of note was 8.6 on April 17. BNP is 549. BUN 35, creatinine 1.63 (of note, it was 1.93 in April 17), potassium 5.7. Troponin, CK-MB and CK total within normal limits. EKG showing normal sinus rhythm, rate 64, no acute ST changes and compared to previous EKG, no significant changes. X-ray of the chest, PA and lateral, on September 23 showing persistent fullness in the mediastinum but no tracheal shift, likely vascular; cardiomegaly with mild pulmonary vascular prominence and tiny left pleural effusion.   ASSESSMENT AND PLAN: We have a 81 year old female with chronic kidney disease, diastolic congestive heart failure, hypertension, history of recurrent gastrointestinal bleeds, history of bout of atrial fibrillation while she was hospitalized this year, currently on amiodarone but not on aspirin, who presents with chest pressure, currently resolved. The patient was given a full-dose aspirin earlier and I will start a baby aspirin tomorrow. Admit her for observation and ruling out acute coronary syndrome. Would cycle the troponins, check a hemoglobin A1c and lipid profile for further risk stratification, and if she does rule out, will order a stress test for tomorrow. Also I will start her on morphine p.r.n. as well as nitroglycerin patch. In regards to the question about antiplatelet agents, I would go ahead and start her on a baby aspirin as dictated above. However, the patient has had multiple hospitalizations for gastrointestinal bleed and has known significant diverticular disease. She is not on aspirin, although she does have a history of atrial fibrillation in one of the recent hospitalizations. This should be discussed with her primary cardiologist from [place] who would see her, and she  states she has an appointment next month. I cannot do a beta blocker, as she had syncope secondary to bradycardias in one of the hospitalizations earlier in the year as well. SHE HAS ALLERGY TO STATIN. At this point, I cannot do a beta blocker or a statin. If she does rule in for acute coronary syndrome, cardiology would be consulted and perhaps a stress test might not be needed. In regards to her chronic kidney disease, this is chronic and at her baseline. It appears to be stage III. The patient does have hyperkalemia, which I would start treating with Kayexalate at this point. There are no acute EKG changes. I would go ahead and trend this again tomorrow. I would hold the Lasix at this point, continue her prednisone and pyridostigmine for her myasthenia gravis, continue her blood pressure medications. Her diastolic congestive heart failure appears to be chronic and she is not in acute flare. I would start her on sequential compression devices and TEDs for deep venous thrombosis prophylaxis.   CODE STATUS: The patient is full code.   TOTAL TIME SPENT: 50 minutes.    ____________________________ Krystal Eaton, MD sa:jm D: 05/09/2013 17:17:08 ET T: 05/09/2013 17:56:34 ET JOB#: [id number]  cc: Krystal Eaton, MD, <Dictator> Leanna Sato, MD Krystal Eaton MD ELECTRONICALLY SIGNED 06/01/2013 14:08

## 2014-12-07 NOTE — Consult Note (Signed)
Chief Complaint:  Subjective/Chief Complaint Patient has done well since yesterday but this afternoon had a small bloody bowel movement. Hemoglobin is better at 9.7. Will hold off on discharge. Clear liquid diet. Repeat cbc in am.   Electronic Signatures: Lurline DelIftikhar, Dhalia Zingaro (MD)  (Signed 06-Apr-14 16:07)  Authored: Chief Complaint   Last Updated: 06-Apr-14 16:07 by Lurline DelIftikhar, Shilpa Bushee (MD)

## 2014-12-07 NOTE — Consult Note (Signed)
Chief Complaint:  Subjective/Chief Complaint Patient is not in her room. No further bleeding or bowel movements. H and H unchanged. Ptaient developed SOB and PE is a consideration.  Impression: Diverticular bleed, resolved. Doubt diverticulitis.  Recommendations: Continue current management.   Electronic Signatures: Lurline DelIftikhar, Michaell Grider (MD)  (Signed 07-Apr-14 18:27)  Authored: Chief Complaint   Last Updated: 07-Apr-14 18:27 by Lurline DelIftikhar, Kayde Atkerson (MD)

## 2014-12-07 NOTE — Consult Note (Signed)
General Aspect 81 year old pleasant African American female with diverticulosis, multiple admissions for diverticular bleed, myasthenia gravis and hypertension who presented to the Emergency Department for bright red blood per rectum. Cardiology has been conulted at the request of the hospitalist for atrial fibrillation.  On arrival, she reported episodes of large bloody stool, CT of abdomen and pelvis done in the Emergency Department showed extensive diverticulosis, she received ciprofloxacin and Flagyl.   During the previous admission the patient had flexible sigmoidoscopy as well as colonoscopy by Dr. Gustavo Lah which showed a few polyps compatible with diverticulosis.   Present Illness . SOCIAL HISTORY: Former smoker, quit 30 years back. Has 30 pack-year history of smoking. Denies using any alcohol or drug use. Currently lives with her niece. Is independent of somewhat of the ADLs.  FAMILY HISTORY: Positive for breast cancer in mother and sister, coronary artery disease in sister.   Physical Exam:  GEN well developed, well nourished   HEENT hearing intact to voice, moist oral mucosa   NECK supple   RESP normal resp effort  clear BS   CARD Regular rate and rhythm  No murmur   ABD denies tenderness  soft   LYMPH negative neck   EXTR negative cyanosis/clubbing, negative edema   SKIN normal to palpation   NEURO cranial nerves intact, motor/sensory function intact   PSYCH alert, A+O to time, place, person, good insight   Review of Systems:  Subjective/Chief Complaint No complaints   General: Legs weak   Skin: No Complaints   ENT: No Complaints   Eyes: No Complaints   Neck: No Complaints   Respiratory: No Complaints   Cardiovascular: No Complaints   Gastrointestinal: No Complaints   Genitourinary: No Complaints   Vascular: No Complaints   Musculoskeletal: No Complaints   Neurologic: No Complaints   Hematologic: No Complaints   Endocrine: No Complaints    Psychiatric: No Complaints   Review of Systems: All other systems were reviewed and found to be negative   Medications/Allergies Reviewed Medications/Allergies reviewed     myesthenia gravis:    BLEEDING DIVERTICULI: 29-Feb-2012   pneumonia:    Hypercholesterolemia:    Hypertension:    Bronchitis:    COLONOSCOPY: 29-Feb-2012   EGD: 26-Feb-2012   fractured left wrist: May 2009       Admit Diagnosis:   GASTROINTESTINAL HEMORRHAGE: Onset Date: 29-Nov-2012, Status: Active, Description: GASTROINTESTINAL HEMORRHAGE  Home Medications: Medication Instructions Status  Cipro 500 mg oral tablet 1 tab(s) orally every 12 hours x 10 days Active  Flagyl 500 mg oral tablet 1 tab(s) orally every 8 hours x 10 days Active  hydrALAZINE 50 mg oral tablet 1 tab(s) orally 2 times a day Active  carvedilol 12.5 mg oral tablet 1 tab(s) orally 2 times a day for high blood pressure and heart Active  lisinopril 20 mg oral tablet 1 tab(s) orally once a day for high blood pressure Active  Mapap 325 mg oral tablet 2 tab(s) orally every 6 hours, As Needed - for Pain (max 3 grams/24 hours) Active  docusate sodium 100 mg oral capsule 1 cap(s) orally once a day to prevent constipation Active  ferrous sulfate 325 mg (65 mg elemental iron) oral tablet 1 tab(s) orally 2 times a day for anemia Active  levalbuterol CFC free 45 mcg/inh inhalation aerosol 2 puff(s) inhaled every 4 hours, As Needed - for Shortness of Breath Active  omeprazole 20 mg oral delayed release capsule 1 cap(s) orally once a day (in the morning) Active  pyridostigmine 60 mg oral tablet 0.5 tab(s) orally 3 times a day Active  predniSONE 10 mg oral tablet 1 tab(s) orally once a day Active   Lab Results:  Routine Chem:  15-Apr-14 05:05   Magnesium, Serum 2.2 (1.8-2.4 THERAPEUTIC RANGE: 4-7 mg/dL TOXIC: > 10 mg/dL  -----------------------)  Glucose, Serum 85  BUN  23  Creatinine (comp)  1.63  Sodium, Serum 142  Potassium, Serum  4.3  Chloride, Serum 100  CO2, Serum  39  Calcium (Total), Serum 9.0  Anion Gap  3  Osmolality (calc) 286  eGFR (African American)  34  eGFR (Non-African American)  30 (eGFR values <19m/min/1.73 m2 may be an indication of chronic kidney disease (CKD). Calculated eGFR is useful in patients with stable renal function. The eGFR calculation will not be reliable in acutely ill patients when serum creatinine is changing rapidly. It is not useful in  patients on dialysis. The eGFR calculation may not be applicable to patients at the low and high extremes of body sizes, pregnant women, and vegetarians.)  Routine Hem:  15-Apr-14 05:05   WBC (CBC) 8.5  RBC (CBC)  3.27  Hemoglobin (CBC)  9.5  Hematocrit (CBC)  29.4  Platelet Count (CBC) 200  MCV 90  MCH 29.1  MCHC 32.3  RDW  16.0  Neutrophil % 64.8  Lymphocyte % 21.5  Monocyte % 12.3  Eosinophil % 0.7  Basophil % 0.7  Neutrophil # 5.5  Lymphocyte # 1.8  Monocyte #  1.0  Eosinophil # 0.1  Basophil # 0.1 (Result(s) reported on 29 Nov 2012 at 05:37AM.)   EKG:  Interpretation EKG 4/3 shows NSR with no significant ST or T wave changes EKG 4/12 shows sinus bradycardia rate 49 bpm EKG 4/14 shows atrial fibrillation rate 151   Radiology Results: XRay:    07-Apr-14 18:11, Chest PA and Lateral  Chest PA and Lateral   REASON FOR EXAM:    Lung V-Q being performed  COMMENTS:       PROCEDURE: DXR - DXR CHEST PA (OR AP) AND LATERAL  - Nov 21 2012  6:11PM     RESULT: Comparison made to prior study 09/11/2012. Cardiomegaly with   pulmonary vascular prominence. Mild atelectasis lung bases. Tiny left   pleural effusion cannot be excluded. Mild component of interstitial edema   cannot be excluded.    IMPRESSION:  Cannot exclude mild CHF.        Verified By: TOsa Craver M.D., MD  Cardiology:    09-Apr-14 15:22, Echo Doppler  Echo Doppler   REASON FOR EXAM:      COMMENTS:       PROCEDURE: EMesa- ECHO DOPPLER  COMPLETE(TRANSTHOR)  - Nov 23 2012  3:22PM     RESULT: Echocardiogram Report    Patient Name:   [name] ANTOSHDate of Exam: 11/23/2012  Medical Rec #:  [id number]          Custom1:  Date of Birth:  [birth year]       Height:       66.0 in  Patient Age:    89years         Weight:       132.0 lb  Patient Gender: F                BSA:          1.68 m??    Indications: CHF  Sonographer:    SJanalee DaneRCS  Referring  Phys: VASIREDDY, [place]    Summary:   1. Left ventricular ejection fraction, by visual estimation, is 60 to   65%.   2. Impaired relaxation pattern of LV diastolic filling.   3. Moderate aortic regurgitation.   4. Moderately elevated pulmonary artery systolic pressure.  2D AND M-MODE MEASUREMENTS (normal ranges within parentheses):  Left Ventricle:          Normal  IVSd (2D):      1.21 cm (0.7-1.1)  LVPWd (2D):     1.04 cm (0.7-1.1) Aorta/LA:                  Normal  LVIDd (2D):     3.94 cm (3.4-5.7) Aortic Root (2D): 3.70 cm (2.4-3.7)  LVIDs (2D):     2.45 cm           Left Atrium (2D): 4.00 cm (1.9-4.0)  LV FS (2D):     37.8 %   (>25%)  LV EF (2D):     68.6 %   (>50%)                                    Right Ventricle:                                    RVd(2D):        6.94 cm  LV DIASTOLIC FUNCTION:  MV Peak E: 0.82 m/s E/e' Ratio: 9.60  MV Peak A: 1.14 m/s Decel Time: 345 msec  E/A Ratio: 0.72  SPECTRAL DOPPLER ANALYSIS (where applicable):  Mitral Valve:  MV P1/2 Time: 100.05 msec  MV Area, PHT: 2.20cm??  Aortic Insufficiency:  AI Half-time:  744 msec  AI Decel Rate: 2.09 m/s??  Tricuspid Valve and PA/RV Systolic Pressure: TR Max Velocity: 3.45 m/s RA     Pressure: 5 mmHg RVSP/PASP: 52.6 mmHg    PHYSICIAN INTERPRETATION:  Left Ventricle: The left ventricular internal cavity size was normal. LV   posterior wall thickness was normal. Left ventricular ejection fraction,   by visual estimation, is 60 to 65%. Spectral Doppler shows impaired   relaxation  pattern of LV diastolic filling.  Left Atrium: The left atrium is normal in size.  Right Atrium: The right atrium is normal in size.  Mitral Valve: Trace mitral valve regurgitation is seen.  Tricuspid Valve: Trivial tricuspid regurgitation is visualized. The   tricuspid regurgitant velocity is 3.45 m/s, and with an assumed right   atrial pressure of 5 mmHg, the estimated right ventricular systolic   pressure is moderately elevated at 52.6 mmHg.  Aortic Valve: Moderate aortic valve regurgitation is seen.  Clifton MD  Electronicallysigned by Lillie MD  Signature Date/Time: 11/24/2012/9:01:34 AM    *** Final ***    IMPRESSION: .        Verified By: Emmit Pomfret. Humphrey Rolls, M.D., MD    Crestor: Unknown  Vital Signs/Nurse's Notes: **Vital Signs.:   15-Apr-14 08:00  Vital Signs Type Routine  Temperature Temperature (F) 97.9  Celsius 36.6  Temperature Source oral  Pulse Pulse 54  Respirations Respirations 17  Systolic BP Systolic BP 503  Diastolic BP (mmHg) Diastolic BP (mmHg) 72  Mean BP 90  Pulse Ox % Pulse Ox % 98  Pulse Ox Activity Level  At rest  Oxygen Delivery 1L  Pulse Ox Heart Rate  17    Impression 81 year old pleasant African American female with diverticulosis, multiple admissions for diverticular bleed, myasthenia gravis and hypertension who presented to the Emergency Department for bright red blood per rectum. Cardiology has been conulted at the request of the hospitalist for atrial fibrillation. Episode of bradycardia, syncope after myasthenia gravis meds were increased.   1) Atrial fibrillation: 2 1/2 hr episode yesterday, converted with amiodarone not a good candidate for anticoagulation given GI bleeding Would hold b-blockers given recent bradycardia, even at discharge --Will change to amiodarone po 400 mg po BID for 4 more days then 200 mg po BID ---If she develops bradycardia, would hold a dose of amiodarone and restart lower dose.  2)  myasthenia gravis Under the care of neurology. OK to use any medications needed. We can adjust cardiac  medications if she develops bradycardia  3) Diastolic CHF moderate aortic valve regurg, Moderate pulmonary HTN Suspect increased right side pressures could be from IVF given on arrival, possible chronic diastolic CHF. Anemia may also exacerbate sx --would continue gentle diuresis with lasix   Plan . *acute diastolic CHF, with moderate AR and pulmonary HTN - decrease  lasix to   po  monitor kidney function. - b bocker held and ok to cont AceI  * ARF on CKD stable, cont to monitor.  *GI bleed - likely diverticular in nature - she will need an outpatient colonoscopy at some point given obscure nature of CT Scan  - hb drifting down, start iron  4) Diverticulitis -  -abx stopped, pt is afebrile and   5) HTN -  Stable on current meds  6)myesthenia gravis -  on prednisone management per neurology and medical service   Electronic Signatures: Ida Rogue (MD)  (Signed 15-Apr-14 10:23)  Authored: General Aspect/Present Illness, History and Physical Exam, Review of System, Past Medical History, Health Issues, Home Medications, Labs, EKG , Radiology, Allergies, Vital Signs/Nurse's Notes, Impression/Plan   Last Updated: 15-Apr-14 10:23 by Ida Rogue (MD)

## 2014-12-07 NOTE — Consult Note (Signed)
Chief Complaint:  Subjective/Chief Complaint Gout both knees   Brief Assessment:  EXTR Right knee no longer painful of swollen. Left kne with some fluid and soreness.  range of motion good both.  No reddness.   Lab Results: BF Analysis:  14-Oct-14 13:34   Body Fluid Source (CC) SYNOVIAL, RT KNEE  Color - (BF) YELLOW  Clarity (BF) CLOUDY  NCC  (nucleated cell count) 21401  Neutrophils  (BF) 92  Lymphocytes  (BF) 2  Monocytes/Macrophages  (BF) 6  Crystals (BF) MONOSODIUM URATE - - - - - - - - - - - - -  Crystals due to injection of intra-articular steroids can be present for up to two weeks following injection.  These crystals may be identical in appearance and polarization characteristics to those present in crystal-induced synovitis, such as monosodium urate and calcium pyrophosphate dehydrate crystals.  Clinical correlation is suggested when crystals are reported after injection of steroids.  Routine Micro:  12-Oct-14 10:39   Culture Comment NO GROWTH IN 48 HOURS  Result(s) reported on 30 May 2013 at 07:00PM.    10:55   Culture Comment NO GROWTH IN 48 HOURS  Result(s) reported on 30 May 2013 at 07:00PM.  14-Oct-14 13:34   Culture Comment NO GROWTH IN 36 HOURS   Assessment/Plan:  Assessment/Plan:  Assessment Bilateral knee gouty arthritis-- + for crystals. Culture negative.    Right better following injection.  Left still sore   Plan Left knee aspirated and 20 cc cloudy fluid removed.   Injected with xylocaine, celestone, and kenalog.  Ice as needed.  follow-up with family MD as needed.   Electronic Signatures: Valinda HoarMiller, Trevaris Pennella E (MD)  (Signed 16-Oct-14 12:53)  Authored: Chief Complaint, Brief Assessment, Lab Results, Assessment/Plan   Last Updated: 16-Oct-14 12:53 by Valinda HoarMiller, Ellis Koffler E (MD)

## 2014-12-07 NOTE — Discharge Summary (Signed)
PATIENT NAME:  [name] Duffy, SCHEERER MR#:  [id number] DATE OF BIRTH:  [birth year]  DATE OF ADMISSION:  05/29/2013 DATE OF DISCHARGE:  06/02/2013  ADMITTING PHYSICIAN: Hilda Lias, MD  DISCHARGING PHYSICIAN: Enid Baas, M.D.   PRIMARY CARE PHYSICIAN: Dr. Darreld Mclean   CONSULTATIONS IN THE HOSPITAL: 1.  Neurology consultation by Dr. Theora Master.  2.  Orthopedic consultation by Dr. Deeann Saint.   DISCHARGE DIAGNOSES: 1.  Acute renal failure.  2.  History of myasthenia gravis.  3.  Chronic kidney disease, stage III.  4.  Diastolic congestive heart failure.  5.  Hypertension. 6.  Hypothyroidism.  7.  Gastroesophageal reflux disease.  8.  Gouty arthritis, status post aspiration and steroid injection to both knees. 9.  Acute respiratory failure from chronic obstructive pulmonary disease and restrictive lung disease requiring 2 liters oxygen.  10.  Anemia of chronic disease.  DISCHARGE HOME MEDICATIONS:  1.  Tylenol 650 mg p.o. q.6 hours p.r.n. for pain. 2.  Prilosec 20 mg p.o. daily.  3.  Levothyroxine 25 mcg p.o. daily.  4.  Amiodarone 200 mg p.o. daily.   5.  Colace 100 mg p.o. daily  6.  Ferrous sulfate 325 mg p.o. daily.  7.  Hydralazine 100 mg p.o. b.i.d.  8.  Pyridostigmine 30 mg p.o. times a day.  9.  Prednisone 10 mg p.o. daily.  10.  Prednisone taper, starting at 40 mg p.o. daily from 06/03/2013 and taper off by 10 mg every day.  11.  Tramadol 50 mg p.o. q.6 hours p.r.n. for pain.  12.  DuoNebs with Albuterol and ipratropium 3 mL inhaled every 6 hours as needed for shortness of breath.  HOME OXYGEN: Yes, 2 liters.   DISCHARGE DIET: Low-sodium diet.   DISCHARGE ACTIVITY: As tolerated.   FOLLOWUP INSTRUCTIONS: 1.  PCP follow-up in 1 to 2 weeks.  2.  Neurology follow-up in [place] as per schedule.  3.  Physical therapy.   LABS AND IMAGING STUDIES PRIOR TO DISCHARGE:  1.  Sodium 143, potassium 5.4, chloride 115, bicarbonate 24, BUN 36, creatinine 2.0 and  glucose 100, calcium of 9.3.  2.  Ultrasound of the kidneys bilaterally showing increased echotexture of renal cortex bilaterally with mild cortical thinning consistent with medical renal disease. No hydronephrosis or solid masses seen.  3.  MRI of the brain without contrast showing involutional changes without evidence of any acute focal abnormalities.  4.  WBCs 10.8, hemoglobin 8.5 , hematocrit 24.9, platelet count 197. 5.  Knee x-ray showing no acute abnormalities, arthritis.  6. Gram stain and cultures negative from the fluid aspirated from knees and positive for monosodium urate crystals.  TSH was elevated at 12.1. Free T4 normal at 0.90. Urinalysis negative for any infection.   BRIEF HOSPITAL COURSE: [name] Duffy is a 81 year old African American female with past medical history significant for myasthenia gravis, hypertension, diabetes, presented to the hospital secondary to worsening generalized weakness and lower extremity edema.  1.  Generalized weakness with both lower extremity pain.  She had swelling of both knees and had x-ray done which did not show any acute changes. She was having low-grade fevers so initially was placed on Rocephin, but later it was found out to be gouty arthritis. Ortho was consulted. She had aspirations done on both knees and crystals were present.  She did get steroid injections locally into the knee and was also given an IV Solu-Medrol shot. The patient's weakness has significantly improved and she worked with physical therapy, who recommended  she might need rehab.  Lower extremity edema could have been related to Norvasc that she was using at home.  That was discontinued in the hospital. She was also on Lasix, however due to renal failure, Lasix was held, and she was on compression stockings and that improved her edema.  2.  Acute renal failure on CKD. Baseline creatinine seems to be around 1.6. She comes in with renal function up to as high as 2.5, probably prerenal  from being on Lasix and poor p.o. intake. She was given IV fluids and her creatinine improved to 2.  She probably had mild progression of underlying CKD. The creatinine is stable at 2. Lasix is being held at the time of discharge. Renal ultrasound did show evidence of chronic kidney disease.  3.  Myasthenia gravis. Initially her generalized weakness was thought to be secondary to worsening of her myasthenia and Neurology was consulted. MRI of the brain did not show any acute findings and per Neurology she could continue taking her prednisone 10 mg daily and pyridostigmine 30 mg p.o. 3 times a day and follow up with her neurologist as an outpatient. She did have noticeable proximal muscle weakness on my exam, though.  However, she will follow up with her neurologist as an outpatient.  4.  Acute respiratory failure, probably has underlying COPD and restrictive lung disease from myasthenia. She had occasional scant wheeze and started on DuoNebs and prednisone taper and remarkably improved her breathing.  She was on home oxygen in the past until a few months ago and recently tapered off and has been requiring oxygen here.  Her sats were fine on room air; however, on exertion and working with PT she was desaturating into the 80s, so is being discharged on 2 liters oxygen and that can be tapered up as an outpatient.  5.  Her course has been otherwise uneventful in the hospital.   DISCHARGE CONDITION: Stable.   DISCHARGE DIS[facility]ITION[facility]   Time Spent on Discharge: 45 minutes.   CODE STATUS:  FULL CODE.  ____________________________ Enid Baasadhika Nabil Bubolz, MD rk:dp D: 06/02/2013 12:47:57 ET T: 06/02/2013 13:05:11 ET JOB#: [id number]  cc: Enid Baasadhika Avier Jech, MD, <Dictator> Leanna SatoLinda M. Miles, MD Enid BaasADHIKA Cheyan Frees MD ELECTRONICALLY SIGNED 06/03/2013 12:19

## 2014-12-07 NOTE — H&P (Signed)
PATIENT NAME:  CATHRINE, KRIZAN MR#:  [id number] DATE OF BIRTH:  [birth year]  DATE OF ADMISSION:  11/18/2012  REFERRING PHYSICIAN:  Bayard Males, MD  PRIMARY CARE PHYSICIAN:  Darreld Mclean, MD  CHIEF COMPLAINT: Bloody stools.   HISTORY OF PRESENT ILLNESS: Ms. Gallardo is a 81 year old pleasant African American female with past medical history of diverticulosis with multiple admissions for diverticular bleed, myasthenia gravis and hypertension who presented to the Emergency Department for bright red blood per rectum. Around 9:00 in the evening started to have large bloody stools. The patient waited for some time, had another episode of bloody bowel movement. Concerning this called her niece and was brought to the Emergency Department. After coming to the Emergency Department, the patient had another episode of large bloody stool. The patient had CT of abdomen and pelvis done in the Emergency Department that showed extensive diverticulosis with diffuse colonic wall thickening within the ascending and sigmoid colon, findings compatible with focus of acute diverticulitis involving the mid to distal descending colon without any signs of bowel obstruction or drainable abscess. The patient received ciprofloxacin and Flagyl in the Emergency Department. During the previous admission the patient had flexible sigmoidoscopy as well as colonoscopy by Dr. Marva Panda which showed a few polyps compatible with diverticulosis. The patient also had EGD which within normal limits. No obvious lesions were seen. The patient did not require any interventions for the bleeding. The patient denies having any abdominal pain, nausea or vomiting. Denies having any fever at home. Denies having any lightheadedness. Vital signs in the Emergency Department showed blood pressure of 189/78. The rest of the values within normal limits.   PAST MEDICAL HISTORY: 1.  Myasthenia gravis. 2.  Diverticulosis with multiple admissions for diverticular  bleeding.  3.  Hyperlipidemia.  4.  Hypertension.  5.  History of asthma.  6.  Diastolic dysfunction.  7.  Chronic kidney disease.   PAST SURGICAL HISTORY:  Cataract in the left eye.  ALLERGIES: CRESTOR, which makes her dizzy and nauseated.   HOME MEDICATIONS: 1.  Tramadol 50 mg 1 tablet every 6 hours.  2.  Pyridostigmine 60 mg 1/2 3 times a day.  3.  Prednisone 10 mg once a day.  4.  Omeprazole 1 capsule once a day.  5.  M-PAP 2 tablets every 6 hours.  6.  Lisinopril 20 mg 1 tablet once a day.  7.  Levalbuterol 2 puffs inhalation every 4 hours as needed.  8.  Hydralazine 10 mg oral 1 tablet 2 times a day.  9.  Ferrous sulfate 325 mg 2 times a day.  10.  Docusate sodium 100 mg once a day.  11.  Carvedilol 12.5 mg 1 tablet 2 times a day.   SOCIAL HISTORY: Former smoker, quit 30 years back. Has 30 pack-year history of smoking. Denies using any alcohol or drug use. Currently lives with her niece. Is independent of somewhat of the ADLs.  FAMILY HISTORY: Positive for breast cancer in mother and sister, coronary artery disease in sister.   REVIEW OF SYSTEMS: CONSTITUTIONAL: Denies any fatigue or weakness.  EYES: No change in vision or discharge from the eyes.  ENT: No decreased hearing. No sore throat or runny nose. RESPIRATORY:  Denies any cough or shortness of breath.  CARDIOVASCULAR: No chest pain, palpitations. No pedal edema.  GASTROINTESTINAL: No nausea or vomiting. Has bright red blood per rectum.  GENITOURINARY: No dysuria or hematochezia.  ENDOCRINE: No polyphagia or polydipsia.  HEMATOLOGIC: No easy bruising.  MUSCULOSKELETAL: Has  back pain.  NEUROLOGIC: Denies any numbness or weakness.  SKIN: No rash or lesions.  PSYCHIATRIC: No anxiety or depression.   PHYSICAL EXAMINATION: GENERAL: This is a well-built, well-nourished age-appropriate female lying down in the bed, not in distress.  VITAL SIGNS: Temperature 98.1, pulse 61, blood pressure 189/78, respiratory rate 18  and oxygen saturations 100% on room air.  HEENT: Normocephalic, atraumatic.  No scleral icterus. Conjunctivae normal. Pupils equal and react to light. Extraocular movements are intact. Mucous membranes moist. No pharyngeal erythema.  NECK: Supple. No lymphadenopathy. No JVD. No carotid bruit.  RESPIRATORY:  Lungs clear to auscultation. No rhonchi or crackles are heard.  HEART:  Regular rate and rhythm.  No murmurs are heard. No lower extremity swelling.  ABDOMEN: Bowel sounds present. Soft, nontender and nondistended. No hepatosplenomegaly. RECTAL:  Heme positive stool.  MUSCULOSKELETAL: Good range of motion in all the extremities.  SKIN: No rash or lesions.  NEUROLOGIC: Cranial nerves II through XII intact. Motor 5 out of 5 in upper and lower extremities. Sensory intact. Alert and oriented to place, person and time. No apparent cranial nerve abnormalities.  Good judgment.   LABORATORY DATA:  CMP is completely within normal limits. Troponin 0.04. Cardiac enzymes: CK 84 and troponin 5.3 Urinalysis negative for nitrites and leukocyte esterase.   ASSESSMENT AND PLAN: [name] is a 81 year old female with known history of diverticulosis who comes to the Emergency Department with bright red blood per rectum.   1.  Gastrointestinal bleed, most likely lower gastrointestinal bleed from diverticular. Admit the patient to the stepdown unit as the patient had large bloody stools at home as well as in the Emergency Department.  Currently vital signs are well within normal limits; however, will continue to have close followup. Will continue to check hemoglobin and hematocrit. Consulted Dr. Marva PandaSkulskie. Keep the patient n.p.o. except medication. Continue with IV fluids. The patient's hemoglobin is 10.7 Will transfuse for rapid drop in hemoglobin.  2.  Hypertension, poorly controlled. Will start back on home medication. The patient is on multiple blood pressure medications.  3.  Myasthenia gravis.  The patient is on  prednisone and pyridostigmine.  Continue with the current medications.  4.  History of asthma.  Currently stable.  5.  Keep the patient on deep vein thrombosis prophylaxis with SCDs.   TIME SPENT: 50 minutes in reviewing old records, discussing with the ER physician, discussing with the patient, physical examination and documentation.  ____________________________ Susa GriffinsPadmaja Anwyn Kriegel, MD pv:sb D: 11/18/2012 08:03:45 ET T: 11/18/2012 08:18:50 ET JOB#: [id number]  cc: Susa GriffinsPadmaja Jeff Mccallum, MD, <Dictator> Leanna SatoLinda M. Miles, MD Clerance LavPADMAJA Nyelle Wolfson MD ELECTRONICALLY SIGNED 11/22/2012 2:07

## 2014-12-07 NOTE — Consult Note (Signed)
Chief Complaint:  Subjective/Chief Complaint Feels well. No BM since yesterday evening. H and H somewhat lower. Vitals stable.  Impression: Diverticular bleed, clinically resolved. ? Diverticulitis.  Recommendations: Full liquid diet. Observe for at least another 24 hours. Will follow.   Electronic Signatures: Lurline DelIftikhar, Carsten Carstarphen (MD)  (Signed 05-Apr-14 09:01)  Authored: Chief Complaint   Last Updated: 05-Apr-14 09:01 by Lurline DelIftikhar, Sheniece Ruggles (MD)

## 2014-12-07 NOTE — Discharge Summary (Signed)
PATIENT NAME:  [name] Duffy, [name] Duffy MR#:  [id number] DATE OF BIRTH:  June 18, 1932  DATE OF ADMISSION:  11/18/2012 DATE OF DISCHARGE:  11/20/2012  ADMISSION DIAGNOSIS:  Gastrointestinal bleed.   DISCHARGE DIAGNOSES:  1.  Lower gastrointestinal bleed likely diverticular in nature.  2.  Diverticulitis as seen on CT but not clinical.  3.  Hypertension.  4.  History of myasthenia gravis.    CONSULTATION:  Dr. Niel Duffy.   PERTINENT LABORATORIES AT DISCHARGE:  Sodium 142, potassium 3.7, chloride 108, bicarb 30, BUN 30, creatinine 1.5, glucose 119.  Troponin 0.04.  Hemoglobin 9.1.   HOSPITAL COURSE:  A 81 year old female who presented with lower GI bleed.  For further details, please refer to the H and P.   1.  Lower GI bleed.   We suspect this to be diverticular in nature as her GI bleed stabilized.  She had no more lower GI bleed symptoms.  She had an initial CT scan that was noncontrasted on admission that showed extensive diverticulitis involving descending colon and diffuse diverticulosis.  Dr. Niel Duffy was consulted regarding the GI bleed as well as questionable asymptomatic diverticulitis.  He did not feel that the patient actually did have diverticulitis since the patient was asymptomatic but did recommend empiric antibiotics.  She was placed initially on n.p.o. then a clear liquid diet and her diet was advanced, which she tolerated. Her hemoglobin has remained stable but she will need close followup with a GI physician as an outpatient and will likely need a colonoscopy as an outpatient as the CT scan could not conclusively rule out a rectal mass.  2.  Diverticulitis as seen on CT scan of the abdomen, but this is a noncontrasted CT.  The patient was completely asymptomatic.  She was empirically placed on Cipro and Flagyl which we will continue.  3.  Hypertension.  The patient's blood pressures have been elevated here in the hospital.  I have increased her hydralazine.  It is still elevated in 160s,  170s.  The patient will need close followup with her primary care physician.  I do not want to adjust her medications too tightly or her blood pressure may bottom out as an outpatient.  4.  Myasthenia gravis.  There are no issues at this time.   DISCHARGE MEDICATIONS:  1.  Coreg 12.5 b.i.d.  2.  Prednisone 10 mg daily.  3.  Lisinopril 20 mg daily.  4.  Tylenol 325 q. 6 hours p.r.n.  5.  Docusate 100 mg daily.  6.  Ferrous sulfate 325 b.i.d.  7.  Omeprazole 20 mg daily.  8.  Pyridostigmine tablets t.i.d.   9.  Flagyl 500 mg q. 8 hours for 10 days.  10.  Cipro 500 mg q. 12 hours x 10 days.  11.  Hydralazine 50 mg b.i.d. which was increased.   DISCHARGE DIET:  Low sodium, low residual.   DISCHARGE ACTIVITY:  As tolerated.   DISCHARGE FOLLOWUP:  The patient will need to follow up with Dr. Niel Duffy in about 2 and Dr. Darreld McleanLinda Duffy, M.D. in a week for her blood pressure as well as a creatinine check.   TIME SPENT: 35 minutes.  ____________________________ [name] ContesSital P. Juliene PinaMody, MD spm:ja D: 11/20/2012 13:08:53 ET T: 11/20/2012 19:21:17 ET JOB#: [id number]  cc: Raeshawn Tafolla P. Juliene PinaMody, MD, <Dictator> [name] SatoLinda M. Miles, MD Lurline DelShaukat Iftikhar, MD [name] ContesSITAL P Jashanti Clinkscale MD ELECTRONICALLY SIGNED 11/21/2012 15:39

## 2014-12-07 NOTE — Discharge Summary (Signed)
PATIENT NAME:  [name] Duffy, [name] Duffy MR#:  [id number] DATE OF BIRTH:  02-07-1932  DATE OF ADMISSION:  05/09/2013 DATE OF DISCHARGE:  05/10/2013  ADMITTING DIAGNOSIS: Chest pressure.  DISCHARGE DIAGNOSES:  1.  Chest pressure of unclear etiology at this time, likely noncardiac with normal cardiac enzymes, negative EKG as well as Myoview which is normal.  2.  Abnormal chest x-ray with mediastinal fullness.  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  3.  Hypertension.  4.  Hyperlipidemia with LDL 135.  5.  Hypothyroidism, new diagnosis.  6.  History of myasthenia gravis.  7.  History of diverticular bleed.  8.  Asthma.  9.  Chronic diastolic congestive heart failure.  10.  Chronic kidney disease.  11.  History of aortic regurgitation as well as pulmonary hypertension.   DISCHARGE CONDITION:  Stable.   DISCHARGE MEDICATIONS: The patient is to resume her outpatient medications which are: 1.  Prednisone 10 mg p.o. daily. 2.   Tylenol 325 mg, 2 tablets every 6 hours as needed.  3.  Iron sulfate 325 mg p.o. twice daily.  4.  Levalbuterol 2 puffs every 4 hours as needed.  5.  Omeprazole 20 mg p.o. daily.  6.  Furosemide 20 mg p.o. daily as needed.  7.  Amiodarone 200 mg p.o. daily.  8.  Hydralazine 100 mg p.o. twice daily.  9.  Pyridostigmine 60 mg p.o. 1/2 tablet 4 times daily.  10.  Tramadol 50 mg every 6 hours as needed.  11.  Docusate sodium 100 mg twice daily as needed.  12.  Levothyroxine 25 mcg p.o. daily. This is a new medication.  13.  Amlodipine 5 mg p.o. daily.   HOME OXYGEN: None.   DIET: 2 grams salt, low fat, low cholesterol, regular consistency.   ACTIVITY LIMITATIONS: As tolerated.   FOLLOW-UP APPOINTMENT: With Dr. Darreld McleanLinda Miles in 2 days after discharge.   CONSULTANTS: Care Management, Social Work.   LABORATORY AND RADIOLOGICAL STUDIES:  Chest x-ray, PA and lateral 05/09/2013, revealed persistent fullness in the mediastinum with no tracheal shift.  These  changes  are  most likely vascular, according to radiologist. Cardiomegaly with mild pulmonary vascular prominence and tiny left pleural effusion were noted. CT scan of chest without contrast on 04/20/2013 revealed substernal goiter.  Myoview stress test 05/10/2013 showed no significant ischemia, no significant wall motion abnormality noted. The estimated ejection fraction was 61%.  The left ventricular global function was normal. There were no EKG changes concerning for ischemia, and there was GI uptake artifact noted , and the study overall was felt to be low-risk scan according to Dr. Julien Nordmannimothy Gollan.  The patient's lab data done on admission was remarkable for elevation of BUN and creatinine to 35 and 1.63, glucose 103, beta-type natriuretic peptide of 449, potassium 5.7, otherwise BMP was unremarkable. The patient's cardiac enzymes x 3 did not show any significant abnormalities. The patient's CBC showed a white blood cell count 8.0, hemoglobin 11.4, platelet count 231.   HISTORY AND PHYSICAL: The patient is a 81 year old African American female with past medical history significant for history of multiple medical problems including myasthenia, diverticulosis, diverticulitis, hyperlipidemia, hypertension, congestive heart failure, diastolic, chronic who presents to the hospital with complaints of chest pressure. Please refer to Dr. Lupe CarneyAhmadzia's admission note on 05/09/2013. On arrival to the hospital, the patient's temperature was 97.9, pulse was 70, respiration rate was 18, blood pressure 162/72, O2 sats were 96% on room air. Physical exam was unremarkable. The patient's EKG  showed normal sinus rhythm at 64 beats per minute. No acute ST-T changes and no significant change since prior EKG.  HOSPITAL COURSE: The patient was admitted to the hospital for further evaluation. Her cardiac enzymes were cycled, and they did not show any significant abnormalities. The patient underwent Myoview stress test which was  pharmacological stress test, and that was also unremarkable. It was felt that the patient's chest pressure was very likely noncardiac, and it was felt that the patient is safe to be discharged home. She was advised to follow up with her primary care physician, Dr. Darreld Mclean, to investigate other possible causes of her chest discomfort.  On the day of discharge, the patient's temperature was 98.7, pulse was 62, respiratory rate was 18 to 20, blood pressure 118/60.  O2 sats were 94% on room air at rest.   In regards to abnormal chest x-ray, as mentioned above the patient's initial chest x-ray, PA and lateral done on the day of admission 05/09/2013, was concerning for possible fullness at the sternal area.  So, CT scan of the patient's chest was performed which showed a substernal goiter. Her TSH was checked and was found to be elevated markedly at 31.7. Free thyroxine was low at 0.66. It was felt that the patient would benefit from treatment with Synthroid and Synthroid was initiated on 25 mcg p.o. daily dose. It is recommended to follow the patient's TSH in the next 4 to 6 weeks after discharge and adjust the patient's Synthroid depending on her needs.   In regards to hypertension, the patient's blood pressure medications were continued, and the patient's blood pressure was intermittently elevated, however, overall control was satisfactory.   In regards to hyperlipidemia, the patient had a lipid panel checked while she was in the hospital and lipid panel revealed cholesterol of 251, LDL was 135, triglycerides were 103, and HDL was 95. The patient had a hemoglobin A1c checked which was found to be 5.9. It was felt that the patient has hyperlipidemia which needs to be treated, and a low fat, low cholesterol diet was recommended.   It is unclear, but the patient may benefit from other cholesterol-lowering medications.   In regards to myasthenia gravis, diverticular disease, asthma, CHF, the patient is to  continue her outpatient management. No changes were made here.  The patient is being discharged in stable condition with the above-mentioned medications and followup.  In regards to chronic renal failure, the patient's kidney function remained stable. On the day of admission, creatinine was 1.63, and the day of discharge the patient's creatinine was 1.66.  Estimated GFR for African American was 34, which was stable.    The patient's vital signs on the day of discharge: Temperature 98.3, pulse was 62, respiratory rate  18, blood pressure 118/60.  O2 sats were 94% on room air at rest.       TIME SPENT: 40 minutes.   ____________________________ Katharina Caper, MD rv:cb D: 05/10/2013 17:29:36 ET T: 05/10/2013 21:32:56 ET JOB#: [id number]  cc: Katharina Caper, MD, <Dictator> Leanna Sato, MD Ranisha Allaire MD ELECTRONICALLY SIGNED 05/15/2013 9:41

## 2014-12-07 NOTE — Consult Note (Signed)
Chief Complaint:  Subjective/Chief Complaint No Further bleeding. No BM H and H unchanged. Plan discussed with Dr. Mitzi HansenMoody. Will sign off. Please call us if needed. Thanks.   Electronic Signatures: Lurline DelIftikhar, Laruth Hanger (MD)  (Signed 08-Apr-14 15:10)  Authored: Chief Complaint   Last Updated: 08-Apr-14 15:10 by Lurline DelIftikhar, Osha Rane (MD)

## 2014-12-07 NOTE — Discharge Summary (Signed)
PATIENT NAME:  [name] Duffy, [name] Duffy MR#:  [id number] DATE OF BIRTH:  Jul 12, 1932  DATE OF ADMISSION:  11/18/2012 DATE OF DISCHARGE:  12/01/2012  ADMITTING DIAGNOSIS:  Gastrointestinal bleed.   DISCHARGE DIAGNOSES: 1.  Lower gastrointestinal bleed felt to be due to diverticular in nature. Seen by gastroenterology, who recommended outpatient follow-up.  2.  Diverticulitis noted on CT scan; however, clinically the patient did not have any evidence of diverticulitis. This was ruled out.  3.  Acute on chronic blood loss anemia from gastrointestinal source.  4.  Syncope due to bradycardia.  5.  Severe symptomatic bradycardia felt to be due to beta blockers. This was discontinued.  5.  Atrial fibrillation with rapid ventricular response noted during hospitalization. The patient was started on amiodarone, cannot do anticoagulation due to her gastrointestinal bleed. She will be followed up as an outpatient. She may eventually need a pacemaker.  6.  Acute respiratory failure, possibly due to diastolic congestive heart failure.  7.  Myasthenia gravis.  8.  Acute diastolic congestive heart failure with an ejection fraction of 60% to 65%.  9.  Moderate aortic regurgitation.  10.  Pulmonary hypertension.  11.  Acute renal failure on chronic kidney disease.  12.  Hypertension.   CONSULTANTS DURING HOSPITALIZATION:  1.  Dr. Julien Nordmannimothy Gollan.  2.  Dr. Cristopher PeruHemang Shah.   3.  Dr. Niel HummerIftikhar.   LABORATORY, DIAGNOSTIC AND RADIOLOGIC DATA:  Admitting laboratory: Urinalysis was nitrite negative, leukocytes negative.   EKG normal sinus rhythm.   CPK was 84. CK-MB was 5.3. Troponin 0.04.  BMP: Glucose 119, BUN 30, creatinine 1.57, sodium 142, potassium 3.7, chloride 108, CO2 was 30, calcium 8.6. LFTs were normal except albumin of 3.1. Admitting to the WBC count 9.5, hemoglobin 10.7, platelet count 206.   CT of the abdomen and pelvis showed findings consistent with possible diverticulitis involving the descending colon,  diffuse diverticulosis and large thoracic aorta.   The patient's hemoglobin did drop to 8.6 on April 5.   PA and lateral chest x-ray on April 7,  showed cannot exclude congestive heart failure. VQ scan was intermediate.   Ultrasound of the lower extremities showed no evidence of deep vein thrombosis.   Echocardiogram of the heart showed LVF was 60% to 65%, impaired relaxation of the left ventricle, diastolic feeling, moderate aortic regurgitation, moderately elevated pulmonary systolic pressures.   Most recent hemoglobin today is 8.6, creatinine today is 1.93.   HOSPITAL COURSE: Please refer to H and P done by the admitting physician. The patient is a 81 year old white female who was admitted with a gastrointestinal bleed. She was thought to have a diverticular bleed. She also had a CT scan which showed diverticulosis and possible diverticulitis. The patient initially was being managed for her acute gastrointestinal bleed and then she subsequently developed syncope, acute respiratory failure, bradycardia, atrial fibrillation with rapid ventricular response. Order of her problems:  1.  Lower gastrointestinal bleed suspected to have lower gastrointestinal bleeding, felt to be due to diverticulosis. The patient has not had any further bleeding. She was seen by Dr. Niel HummerIftikhar who recommended outpatient colonoscopy, if able tolerate. The patient was also thought to have possible diverticulitis, so she was treated on Cipro and Flagyl. This was subsequently discontinued after she finished 10 days of the treatment.  2.  Severe bradycardia. The patient had a rapid response called April 12, due syncopal episode. She was also very dizzy and she was noted to have a heart rate in the 30s, so she was  placed in the Intensive Care Unit and monitored there. Her beta blocker was discontinued. She was seen by cardiology and with stopping of the beta blocker and holding of  for a day. Her heart rate improved. Her syncopal  episode was felt to be due to bradycardias. She has not had any further episodes. Her heart rate has been stable in the 60s.  3.  Atrial fibrillation with rapid ventricular response. The patient developed atrial fibrillation with rapid ventricular response. Her echo showed ejection fraction 60% to 65%. She was seen by Dr. Mariah Milling who put her on amiodarone. In light of her atrial fibrillation and bradycardia, she may need a pacemaker in the near future.  4.  Acute respiratory failure. The patient was thought to have acute diastolic congestive heart failure. She was weaned down to room air and currently she is not short of breath. She also has  elevated pulmonary pressures.  5.  History of myasthenia gravis. Dr. Sherryll Burger saw the patient and did not think that there were any issues with myasthenia gravis. He recommended continuing her current regimen.  6.  Acute renal failure on chronic kidney disease. The patient's creatinine is elevated a 1.93. Her baseline, is around 1.4, so I recommended she have a BMP checked with her primary care provider if it continues to be elevated. She should be referred to nephrology. The patient also of note, had a VQ scan to rule out pulmonary embolism, but it was intermediate and her symptoms were inconsistent with PE. She also had possible congestive heart failure on the chest x-ray, so the test was hard to interpret. At this time, the patient is doing much better. Heart rate is stable. She has been ambulated, doing okay, has not had any further bleeding. She okayed by cardiology to be discharged home. Discharge instructions for congestive heart failure given.   DISCHARGE MEDICATIONS: Prednisone 10 daily, Tylenol 650 q. 6 p.r.n. for pain, iron sulfate 325 mg 1 tab p.o. b.i.d.,  inhalation q. 4 p.r.n. shortness of breath, omeprazole  20 daily, pyridostigmine 60 mg 1/2 tablet t.i.d., hydralazine 50, one tab p.o. b.i.d., amiodarone 200, 1 tab p.o. b.i.d. x 2 weeks, then 1 tab p.o. daily  after 2 weeks, Lasix 20 mg daily as needed for swelling.   HOME HEALTH: Yes. The patient will have physical therapy and nurse.   OXYGEN: 2 liters nasal cannula only at nighttime.   DIET: Low sodium, low residual.   ACTIVITY: As tolerated.   FOLLOWUP: Follow up with Darreld Mclean in 1 to 2 weeks, Dr. Mariah Milling in 1 to 2 weeks. The patient is to have a BMP check at the time of visit to primary M.D., also follow up with Dr. Niel Hummer in 4 to 6 weeks for possible colonoscopy.   TIME SPENT: 45 minutes spent on the discharge.    ____________________________ Lacie Scotts. Allena Katz, MD shp:cc D: 12/01/2012 15:30:20 ET T: 12/01/2012 16:42:03 ET JOB#: [id number]  cc: Kenishia Plack H. Allena Katz, MD, <Dictator> Charise Carwin MD ELECTRONICALLY SIGNED 12/11/2012 13:03

## 2014-12-07 NOTE — Consult Note (Signed)
Brief Consult Note: Diagnosis: Acute gouty arthritis both knees.   Patient was seen by consultant.   Recommend to proceed with surgery or procedure.   Recommend further assessment or treatment.   Orders entered.   Discussed with Attending MD.   Comments: 81 year old female complains of bilateral knee pain and stiffness, right much worse than left.  This has rendered her unable to ambulate well.  She has multiple medical problems including renal failure  as well being treated by the medical service. Uric acid elevated at 8.1   white blood count normal.  Low grade fever.   Exam:  Alert and cooperative.  Both knees with 2-3+ effusions.  Right more painful.  range of motion 0-75 degrees restricted by pain and fluid.  Skin warm throughout.  No reddness at knees.  Generalized mild pain to palpation.  circulation/sensation/motor function good distally.  X-rays:  mild degenerative changes.  No fracture or sign of infection.  Imp:  Acute gouty arthritis  Rx:   At about 13:30 10/14 the right knee was aspirated and 40cc cloudy fluid removed and sent to lab for cell count, differential, crystals, and culture.           Injected right knee with xylocaine, celestone, and kenalog.           Will evaluate left knee for possible injection tomorrow if needed..  Electronic Signatures: Valinda HoarMiller, Zyrus Hetland E (MD)  (Signed 15-Oct-14 00:12)  Authored: Brief Consult Note   Last Updated: 15-Oct-14 00:12 by Valinda HoarMiller, Johnta Couts E (MD)

## 2014-12-07 NOTE — Consult Note (Signed)
Referring Physician:  Henreitta Leber   Primary Care Physician:  Marguerita Merles : [facility], [address] [place], [place] [address], [place] [phone]  Reason for Consult: Admit Date: 28-May-2013  Chief Complaint: Lower extremity edema and generalized weakness.  Reason for Consult: weakness   History of Present Illness: History of Present Illness:   81 year old woman presents to hospital with worsening RLE weakness, swelling and pain, primarily in the right ankle and foot.  Been getting gradually worse over the past few days per patient and her sister.  Found to have acute on chronic renal failure upon admission.  Patient has history of MG.  However, patient denies shortness of breath, generalized weakness, dysarthria, dysphagia.  Patient's sister says patient has been "dragging" her right foot since about three days ago.  Patient says it hurts to move the right leg, says she was told she has "extra fluid in the right knee" that needs drained.  Patient reports she was diagnosed with MG in 2012.  She has been taking mestinon 30 mg tid and also prednisone.  She sees Dr. Jannifer Franklin in [place] for her [facility] management, last saw him in June or July 2014.   MEDICAL HISTORY: diastolic congestive heart failure, gravis.  SURGICAL HISTORY:neurosurgical history. HISTORY: smoking. alcohol abuse. No illicit drug abuse. Lives at home with her sister.  HISTORY: mother and father are deceased. died from complications of congestive heart failure. Mother also had breast cancer. died from a stroke. MEDICATIONS: 200 mg daily, 100 mg daily, 5 mg daily, iron sulfate 325 mg daily, 20 mg daily as needed, 100 mg b.i.d., 25 mcg daily, 650 q.6 hours as needed, 20 mg daily, 10 mg daily, 30 mL t.i.d.  tramadol 50 mg daily as needed.   ALLERGIES:      ROS:  General pain   HEENT no complaints   Lungs no complaints   Cardiac no complaints   GI no complaints   GU no complaints   Musculoskeletal no  complaints   Extremities swelling  pain   Skin no complaints   Endocrine no complaints   Psych no complaints   Past Medical/Surgical Hx:  CHF:   myesthenia gravis:   BLEEDING DIVERTICULI:   pneumonia:   Hypercholesterolemia:   Hypertension:   Bronchitis:   COLONOSCOPY:   EGD:   fractured left wrist:   Home Medications: Medication Instructions Last Modified Date/Time  levothyroxine 25 mcg (0.025 mg) oral tablet 1 tab(s) orally once a day 12-Oct-14 17:28  amLODIPine 5 mg oral tablet 1 tab(s) orally once a day 12-Oct-14 17:28  Mapap 325 mg oral tablet 2 tabs (641m) orally every 6 hours, as needed for pain (max 3 grams/24 hours) 12-Oct-14 17:28  omeprazole 20 mg oral delayed release capsule 1 cap(s) orally once a day (in the morning) 12-Oct-14 17:28  amiodarone 200 mg oral tablet 1 tab(s) orally once a day 12-Oct-14 17:28  docusate sodium sodium 100 mg oral capsule 1 cap(s) orally once a day to prevent constipation. 12-Oct-14 17:28  ferrous sulfate 325 mg (65 mg elemental iron) oral tablet 1 tab(s) orally once a day 12-Oct-14 17:28  furosemide 20 mg oral tablet 1 tab(s) orally once a day as needed for swelling. 12-Oct-14 17:28  hydrALAZINE 100 mg oral tablet 1 tab(s) orally 2 times a day 12-Oct-14 17:28  pyridostigmine 60 mg oral tablet 0.5 tab (370m orally 3 times a day. 12-Oct-14 17:28  traMADol 50 mg oral tablet 1 tab(s) orally as needed for  pain. 12-Oct-14 17:28  predniSONE 10 mg oral tablet 1 tab(s) orally once a day 12-Oct-14 17:28   Allergies:  Crestor: Unknown  Vital Signs: **Vital Signs.:   13-Oct-14 12:54  Vital Signs Type Routine  Temperature Temperature (F) 99.6  Celsius 37.5  Temperature Source oral  Pulse Pulse 77  Respirations Respirations 20  Systolic BP Systolic BP 481  Diastolic BP (mmHg) Diastolic BP (mmHg) 64  Mean BP 84  Pulse Ox % Pulse Ox % 95  Pulse Ox Activity Level  At rest  Oxygen Delivery 2L; Nasal Cannula   EXAM: PHYSICAL  EXAMINATION  GENERAL: Pleasant.  NAD.  Normocephalic and atraumatic.  EYES: Funduscopic exam shows normal disc size, appearance and C/D ratio without clear evidence of papilledema.  CARDIOVASCULAR: S1 and S2 sounds are within normal limits, without murmurs, gallops, or rubs.  MUSCULOSKELETAL: Swelling and edema noted in right knee and right foot. Bulk - Normal Tone - Normal Pronator Drift - Absent bilaterally. Ambulation - Gait and station are not tested due to falls precautions.  R/L 5/5    Shoulder abduction (deltoid/supraspinatus, axillary/suprascapular n, C5) 5/5    Elbow flexion (biceps brachii, musculoskeletal n, C5-6) 5/5    Elbow extension (triceps, radial n, C7) 5/5    Finger adduction (interossei, ulnar n, T1)   4/5    Hip flexion (iliopsoas, L1/L2) 4/5    Knee flexion (hamstrings, sciatic n, L5/S1) 4/5    Knee extension (quadriceps, femoral n, L3/4) 4/5    Ankle dorsiflexion (tibialis anterior, deep fibular n, L4/5) 4/5    Ankle plantarflexion (gastroc, tibial n, S1)  NEUROLOGICAL: MENTAL STATUS: Patient is oriented to person, place and time.  Recent and remote memory are intact.  Attention span and concentration are intact.  Naming, repetition, comprehension and expressive speech are within normal limits.  Patient's fund of knowledge is within normal limits for educational level.  CRANIAL NERVES: Normal    CN II (normal visual acuity and visual fields) Normal    CN III, IV, VI (extraocular muscles are intact) Normal    CN V (facial sensation is intact bilaterally) Normal    CN VII (facial strength is intact bilaterally) Normal    CN VIII (hearing is intact bilaterally) Normal    CN IX/X (palate elevates midline, normal phonation) Normal    CN XI (shoulder shrug strength is normal and symmetric) Normal    CN XII (tongue protrudes midline)   SENSATION: Intact to pain and temp bilaterally (spinothalamic tracts) Intact to position and vibration bilaterally  (dorsal columns)   REFLEXES: R/L 2+/2+    Biceps 2+/2+    Brachioradialis   2+/2+    Patellar 2+/2+    Achilles   COORDINATION/CEREBELLAR: Finger to nose testing is within normal limits..  Lab Results:  Thyroid:  13-Oct-14 05:14   Thyroid Stimulating Hormone  12.1 (0.45-4.50 (International Unit)  ----------------------- Pregnant patients have  different reference  ranges for TSH:  - - - - - - - - - -  Pregnant, first trimetser:  0.36 - 2.50 uIU/mL)  Thyroxine, Free 0.90 (Result(s) reported on 29 May 2013 at 11:54AM.)  Hepatic:  12-Oct-14 10:55   Bilirubin, Total 0.5  Alkaline Phosphatase 66  SGPT (ALT) 15  SGOT (AST) 17  Total Protein, Serum 6.6  Albumin, Serum  2.7  Routine Micro:  12-Oct-14 10:55   Micro Text Report BLOOD CULTURE   COMMENT  NO GROWTH IN 8-12 HOURS   ANTIBIOTIC                       Culture Comment NO GROWTH IN 8-12 HOURS  Result(s) reported on 29 May 2013 at 03:00AM.  Routine Chem:  12-Oct-14 10:55   B-Type Natriuretic Peptide [facility])  818 (Result(s) reported on 28 May 2013 at 11:53AM.)  13-Oct-14 05:14   Uric Acid, Serum  8.1 (Result(s) reported on 29 May 2013 at 02:22PM.)  Glucose, Serum 85  BUN  42  Creatinine (comp)  2.33  Sodium, Serum 144  Potassium, Serum 5.1  Chloride, Serum  111  CO2, Serum 25  Calcium (Total), Serum 9.1  Anion Gap 8  Osmolality (calc) 297  eGFR (African American)  22  eGFR (Non-African American)  19 (eGFR values <12m/min/1.73 m2 may be an indication of chronic kidney disease (CKD). Calculated eGFR is useful in patients with stable renal function. The eGFR calculation will not be reliable in acutely ill patients when serum creatinine is changing rapidly. It is not useful in  patients on dialysis. The eGFR calculation may not be applicable to patients at the low and high extremes of body sizes, pregnant women, and vegetarians.)  Cardiac:  12-Oct-14 10:55   Troponin I < 0.02  (0.00-0.05 0.05 ng/mL or less: NEGATIVE  Repeat testing in 3-6 hrs  if clinically indicated. >0.05 ng/mL: POTENTIAL  MYOCARDIAL INJURY. Repeat  testing in 3-6 hrs if  clinically indicated. NOTE: An increase or decrease  of 30% or more on serial  testing suggests a  clinically important change)  Routine UA:  12-Oct-14 10:55   Color (UA) Yellow  Clarity (UA) Hazy  Glucose (UA) Negative  Bilirubin (UA) Negative  Ketones (UA) Negative  Specific Gravity (UA) 1.011  Blood (UA) Negative  pH (UA) 5.0  Protein (UA) 30 mg/dL  Nitrite (UA) Negative  Leukocyte Esterase (UA) Negative (Result(s) reported on 28 May 2013 at 11:41AM.)  RBC (UA) 1 /HPF  WBC (UA) 1 /HPF  Bacteria (UA) 3+  Epithelial Cells (UA) 4 /HPF  Mucous (UA) PRESENT (Result(s) reported on 28 May 2013 at 11:41AM.)  Routine Hem:  13-Oct-14 05:14   WBC (CBC) 10.4  RBC (CBC)  3.13  Hemoglobin (CBC)  9.7  Hematocrit (CBC)  28.3  Platelet Count (CBC) 185  MCV 91  MCH 30.9  MCHC 34.2  RDW  15.8  Neutrophil % 70.1  Lymphocyte % 13.6  Monocyte % 15.4  Eosinophil % 0.3  Basophil % 0.6  Neutrophil #  7.3  Lymphocyte # 1.4  Monocyte #  1.6  Eosinophil # 0.0  Basophil # 0.1 (Result(s) reported on 29 May 2013 at 05:50AM.)   Impression/Recommendations: Recommendations:   81year old woman presents to hospital with worsening RLE weakness, swelling and pain, primarily in the right ankle and foot.   is seen to have predominantly RLE pain and swelling in the right knee but also in the foot and great toe.  Uric acid level is elevated.  There is concern for gouty flare.  I do not believe this patient is suffering a MG exacerbation.  Her new weakness is too focal and located in areas of pain and swelling which would be highly atypical for MG.  Would not adjust her MG medications at this time.  No ptosis, only mild fatiguability on exam.  No other complaints of worsening MG such as dysphagia, dysarthria, SOB, generalized weakness,  ptosis.  Patient also noted to  have acute on chronic renal failure.  Given focal RLE weakness out of proportion to elsewhere, would recommend Brain MRI without contrast to rule out stroke, have ordered this. MRI without contrast.changes to MG regimen at present, close followup with her outpatient neurologist upon discharge Dr. Jannifer Franklin.gout as cause for her RLE swelling and pain, pain resulting in limited focal motor function rather than due to muscle weakness.and OT for difficulty walking. have reviewed the results of the most recent tests and labs as outlined above and answered all related questions.  and coordinated plan of care with hospitalist.   Electronic Signatures: Anabel Bene (MD)  (Signed 14-Oct-14 00:48)  Authored: REFERRING PHYSICIAN, Primary Care Physician, Consult, History of Present Illness, Review of Systems, PAST MEDICAL/SURGICAL HISTORY, HOME MEDICATIONS, ALLERGIES, NURSING VITAL SIGNS, Physical Exam-, LAB RESULTS, Recommendations   Last Updated: 14-Oct-14 00:48 by Anabel Bene (MD)

## 2014-12-07 NOTE — Consult Note (Signed)
PATIENT NAME:  [name] Duffy, Charline MR#:  [id number] DATE OF BIRTH:  [birth year]  DATE OF CONSULTATION:  11/18/2012  CONSULTING PHYSICIAN:  Lurline DelShaukat Devun Anna, MD  REASON FOR CONSULTATION: Lower gastrointestinal bleed.   HISTORY OF PRESENT ILLNESS: A 81 year old African-American female with history of pan-diverticulosis, at least 1 admission with diverticular bleed last year, also history of myasthenia gravis and hypertension. The patient presented to the Emergency Room earlier today with multiple episodes of bright red blood per rectum. Denies any abdominal pain, nausea, vomiting, hematemesis or melena. The patient had another episode of hematochezia in the Emergency Room, although she has not had any further bleeding since she is up in ICU for the last few hours. Denies any other significant symptoms.   PAST MEDICAL HISTORY: Significant for history of myasthenia gravis, diverticulosis with admissions for prior diverticular bleeding, hyperlipidemia, hypertension, asthma, history of chronic kidney disease, history of diastolic dysfunction.   ALLERGIES: CRESTOR.   MEDICINES AT HOME: Include tramadol, pyridostigmine, prednisone, omeprazole, lisinopril, hydralazine, ferrous sulfate, docusate sodium and carvedilol.  SOCIAL HISTORY: The patient used to be a smoker, quit about 30 years ago. No drinking.   FAMILY HISTORY: Positive for breast cancer.   REVIEW OF SYSTEMS: Grossly negative except for what is mentioned in the history of present illness.   PHYSICAL EXAMINATION:  GENERAL: Elderly but well-built female, does not appear to be in any acute distress.  VITAL SIGNS: She is afebrile. Heart rate is in 50s and 60s, respirations about 20, blood pressure 180/81.  LUNGS: Clear to auscultation bilaterally.  CARDIOVASCULAR: Regular rate and rhythm. No tachycardia or murmurs were noted.  ABDOMEN: Examination is fairly benign, with a soft abdomen that is not distended. No rebound or guarding was noted,  nontender. No hepatosplenomegaly or ascites.  NEUROLOGIC: Appears to be fairly unremarkable.   LABORATORIES: Hemoglobin on admission was 10.7, is 9.8, white cell count normal at 9.7, platelet count is 197. Troponin 0.804. Liver enzymes are normal. Electrolytes are fairly unremarkable. Creatinine is 1.57. CT scan of abdomen and pelvis showed pan-diverticulosis. Thickening of the wall of descending and sigmoid colon was noted with some pericolonic stranding and raising suspicion of possible diverticulitis or colitis in that area.   ASSESSMENT AND PLAN: The patient with what appears to be diverticular bleed. The patient has history of prior diverticular bleedings as well. Colonoscopy done by Dr. Marva PandaSkulskie about 9 months ago showed pan-diverticulosis. There is some suggestion on the CT of possible diverticulitis or colitis, although clinically, there is no such evidence. The patient has no abdominal pain, abdominal tenderness, fever or leukocytosis. These changes are probably chronic. The patient currently seems to be doing fairly well, without any further bleeding, and she is hemodynamically very stable. I will start her on clear liquid diet, follow H and H and transfuse if needed. If there are signs of significant active bleed, please opt in a STAT bleeding scan and consult vascular surgery for possible angiogram and embolization. Antibiotics can be continued for now, although again, as mentioned above, there is no clinical evidence of diverticulitis or colitis. Will follow.    ____________________________ Lurline DelShaukat Jonai Weyland, MD si:OSi D: 11/18/2012 12:51:49 ET T: 11/18/2012 13:02:32 ET JOB#: [id number]  cc: Lurline DelShaukat Satchel Heidinger, MD, <Dictator> Lurline DelSHAUKAT Lita Flynn MD ELECTRONICALLY SIGNED 11/20/2012 17:27

## 2014-12-07 NOTE — Consult Note (Signed)
Brief Consult Note: Diagnosis: myasthenia gravis related brathing issues?.   Patient was seen by consultant.   Consult note dictated.   Comments: - pt was diagnosed 3 years ago with MG due to ptosis and pelvic girdle weakness. ? Ab status. - currently pt doesn't feel like her initial MG symptoms are worse. Her SOB and hypoxia can be explained by diastolic CHF, anemia etc ? - got tranasferred to ICU for bradycardia and hypotension - don't increase dose of pyridostigmine (don't take off her basal (30 mg tid) dose for too long either). Avoid meds that can worsen MG (e.g. CCB, fluroquinolones), (try to avoid neuromuscular blockers if she has to be intubated in future).  - No s/s suggestive of botulisum, tick paralysis, LEMS etc. - in long run should give prednisone 10 mg alternate day. - short run NIF q 6 hrs, low threshould for use of BiPAP if she seem to struggle - will send [facility] binding, blocking and modulting antibody panel.  - (send note to Dr. Anne HahnWillis - [facility]).  Electronic Signatures: Jolene ProvostShah, Hemang Kalpeshkumar (MD)  (Signed 12-Apr-14 11:33)  Authored: Brief Consult Note   Last Updated: 12-Apr-14 11:33 by Jolene ProvostShah, Hemang Kalpeshkumar (MD)

## 2014-12-07 NOTE — H&P (Signed)
PATIENT NAME:  [name] Duffy, [name] Duffy MR#:  [id number] DATE OF BIRTH:  April 13, 1932  DATE OF ADMISSION:  05/28/2013  PRIMARY CARE PHYSICIAN: Leanna SatoLinda M. Miles, MD  CHIEF COMPLAINT: Lower extremity edema and generalized weakness.   HISTORY OF PRESENT ILLNESS: This is a 81 year old female who presents to the hospital due to lower extremity edema and generalized weakness. She says that this has developed over the past couple of days. She denies any weight gain specifically or denies any paroxysmal nocturnal dyspnea, orthopnea or any shortness of breath. She came to the ER for further evaluation. The patient was noted to be in mild renal failure. She has underlying chronic kidney disease stage 3, baseline creatinine around 1.5 to 1.6, it is currently elevated at 2.5. Hospitalist services were contacted for further treatment and evaluation.   The patient denies any chest pain, any palpitations. She does admit to poor p.o. intake and 1 episode of nausea, vomiting yesterday which was nonbloody and bilious in nature.   REVIEW OF SYSTEMS: CONSTITUTIONAL: No documented fever. No weight gain or weight loss.  EYES: No blurred or double vision.  ENT: No tinnitus. No postnasal drip. No redness of the oropharynx.  RESPIRATORY: No cough, no wheeze, no hemoptysis, no dyspnea.  CARDIOVASCULAR: No chest pain, no orthopnea, no palpitations, no syncope.  GASTROINTESTINAL: No nausea, no vomiting, no diarrhea. No abdominal pain. No melena or hematochezia.  GENITOURINARY: No dysuria or hematuria.  ENDOCRINE: No polyuria or nocturia. No heat or cold intolerance.  HEMATOLOGIC: No anemia, no bruising, no bleeding.  INTEGUMENTARY: No rashes. No lesions.  MUSCULOSKELETAL: No arthritis. No swelling. No gout.  NEUROLOGIC: No numbness. No tingling. No ataxia. No seizure-type activity.  PSYCH:  No anxiety, no insomnia. No ADD.   PAST MEDICAL HISTORY: Consistent with history of diastolic congestive heart failure, hypertension,  hypothyroidism, GERD, myasthenia gravis.   ALLERGIES: CRESTOR.   SOCIAL HISTORY: No smoking. No alcohol abuse. No illicit drug abuse. Lives at home with her sister.   FAMILY HISTORY: Both mother and father are deceased. Mother died from complications of congestive heart failure. Father died from a stroke. Mother also had breast cancer.   CURRENT MEDICATIONS: Amiodarone 200 mg daily, Colace 100 mg daily, amlodipine 5 mg daily, iron sulfate 325 mg daily, Lasix 20 mg daily as needed, hydralazine 100 mg b.i.d., Synthroid 25 mcg daily, Tylenol 650 q.6 hours as needed, omeprazole 20 mg daily, prednisone 10 mg daily, pyridostigmine 30 mL t.i.d. and tramadol 50 mg daily as needed.   PHYSICAL EXAMINATION:  Presently is as follows: VITAL SIGNS: Temperature is 100.3, pulse 68, respirations 16, blood pressure 113/58, sats 96% on 2 liters nasal cannula.  GENERAL: She is a pleasant-appearing female, lethargic but in no apparent distress.  HEAD, EYES, EARS, NOSE, THROAT EXAM: The patient is atraumatic, normocephalic. Extraocular muscles are intact. Pupils equal and reactive to light. Sclerae anicteric. No conjunctival injection. No pharyngeal erythema.  NECK: Supple. There is no jugular venous distention. No bruits, no lymphadenopathy, no thyromegaly.  HEART: Regular rate and rhythm. She does have a 2 out of 6 ejection murmur heard at the right sternal border. No rubs, no clicks.  LUNGS: Clear to auscultation bilaterally. No rales, no rhonchi, no wheezes.  ABDOMEN: Soft, flat, nontender, nondistended. Has good bowel sounds. No hepatosplenomegaly appreciated.  EXTREMITIES: No evidence of any cyanosis or clubbing. She does have +1 pitting edema from the knees to the ankles bilaterally, +2 pedal and radial pulses bilaterally.  NEUROLOGICAL: The patient is alert, awake and  oriented x 3 with no focal motor or sensory deficits appreciated bilaterally.  SKIN: Moist and warm with no rashes appreciated.  LYMPHATIC:  There is no cervical or axillary lymphadenopathy.   LABORATORY DATA: Serum glucose of 74, BUN 41, creatinine 2.5, sodium 140, potassium 5, chloride 108, bicarb 26, albumin is 2.7. LFTs within normal limits. Troponin less than 0.02. White cell count 11.9, hemoglobin 9.6, hematocrit 28.4, platelet count 179. Urinalysis is within normal limits.   IMAGING:  The patient did have a chest x-ray done which showed no evidence of acute cardiopulmonary disease.   ASSESSMENT AND PLAN: This is a 81 year old female with a history of hypertension, myasthenia gravis, hyperlipidemia, asthma, diastolic congestive heart failure, chronic kidney disease stage 3 who presents the hospital with lower extremity edema and weakness and noted to be in acute on chronic renal failure.  1.  Acute on chronic renal failure. The patient's baseline creatinine is around 1.5 to 1.6. It is currently elevated at 2.5. This is likely due to dehydration and poor p.o. intake. I will gently hydrate the patient with IV fluids, follow BUN and creatinine and urine output. Hold her Lasix for now.  2.  Lower extremity edema. I suspect this is related to the Norvasc she is taking.  I do not suspect any evidence of congestive heart failure or any deep vein thrombosis. She has no weight gain and no paroxysmal nocturnal dyspnea. It could also possibly be related to poor mobility and hypoalbuminemia. For now, will discontinue her Norvasc and monitor.  3. Generalized weakness.  This is likely related to her renal failure and her lower extremity edema. I will get a physical therapy consult to assess her mobility status, possibly could also be related to her underlying myasthenia gravis.  4.  Myasthenia gravis continue with her prednisone and pyridostigmine. 5.  Hypothyroidism. Continue Synthroid.  6.  Gastroesophageal reflux disease. Continue omeprazole.  7.  Hypertension. Continue hydralazine.  8.  CODE STATUS: The patient is a full code.   TIME SPENT ON  THIS ADMISSION: 50 minutes.   ____________________________ Rolly Pancake. Cherlynn Kaiser, MD vjs:cs D: 05/28/2013 17:38:16 ET T: 05/28/2013 18:04:13 ET JOB#: [id number]  cc: Rolly Pancake. Cherlynn Kaiser, MD, <Dictator> Houston Siren MD ELECTRONICALLY SIGNED 05/30/2013 11:16

## 2014-12-07 NOTE — Consult Note (Signed)
PATIENT NAME:  [name] Duffy, [name] Duffy MR#:  [id number] DATE OF BIRTH:  [birth year]  DATE OF CONSULTATION:  11/26/2012  REFERRING PHYSICIAN:  Loletha Grayer, MD CONSULTING PHYSICIAN:  Geron Mulford K. Manuella Ghazi, MD  REASON FOR CONSULTATION:  Worsening of myasthenia gravis as a cause of her respiratory decline.   HISTORY OF PRESENT ILLNESS:  The patient is a 81 year old African American female who was diagnosed with myasthenia gravis around in 2011 by Dr. Jannifer Franklin because of her generalized muscle pain and weakness, mostly in her pelvic girdle with difficulty walking, when standing up, etc., as well as was having difficulty with droopy eyelids.   The patient was started on pyridostigmine and prednisone which significantly improved her symptoms.   The patient was hospitalized on 11/19/2011 because of GI bleed. The patient had evaluation and it is likely coming from her diverticulitis.   Around Wednesday, 11/23/2012, the patient felt to have worsening of her breathing. Initially was thought to be due to PE, which was ruled out.  Echocardiogram showed the patient had diastolic failure with moderate aortic regurg and pulmonary hypertension causing pulmonary edema.   Even after optimally treating her CHF, patient continued to have hypoxic episode on exertion and there was a concern with the patient has worsening of her myasthenia gravis, so neurology consult was called.   On the night of April 11, the patient had an episode of severe bradycardia and hypotension, questionably after increasing dose of pyridostigmine for the treatment of myasthenia gravis and the patient was transferred to the Intensive Care Unit for further management.   Now her pyridostigmine has been on hold and she has been started on hyoscyamine.   The patient declined any symptoms right now, otherwise, feeling faint when she tried to sit up.     PAST MEDICAL HISTORY:  Significant for myasthenia gravis, recurrent lower GI bleed, diverticulosis  with multiple admissions, hyperlipidemia, hypertension, history of asthma, diastolic dysfunction, chronic kidney disease.   PAST SURGICAL HISTORY:  Significant for cataract surgery in the left eye.   I reviewed her allergies. I reviewed her home medication list.   SOCIAL HISTORY: She is a former smoker, quit around 30 years ago. She has a 30-pack history of smoking. She denies use of alcohol or drugs. She currently lives with her niece and is independent of her 25 years.  FAMILY HISTORY:  Positive for breast cancer in mother and sister and coronary artery disease in sister.   REVIEW OF SYSTEMS:  Positive for feeling a little bit short of breath, feeling dizzy when she suddenly sits up and she is not having currently diarrhea or any blood in her stool.   Her other 10-system review of system was asked and was found to be negative; specifically, she does not have any new ptosis or speaking difficulty or swallowing difficulty and does not feel weaker in her legs.   PHYSICAL EXAMINATION: VITAL SIGNS: Temperature was 98.1, pulse 54, respiratory rate 24, blood pressure 89/45, pulse ox 100% on 2 liters of oxygen.  GENERAL:  The patient is an elderly-looking African American female lying in bed, not in acute distress, surrounded by family members and pastor. LUNGS:  Decreased air entry.  HEART:  S1, S2 heart sounds. Carotid exam did not reveal any bruit.  MENTAL STATUS:  She was alert, oriented, followed 2-step inverted commands. Said she knew the current president and previous president's name. She was able to name the people surrounding her. Her attention and concentration seems to be appropriate for her age and  medical condition. Her memory seems to be intact.  CRANIAL NERVES:  Her pupils are equal, round and reactive. She has intraocular lens on the left but grayish discoloration of her lens on the right.   Her extraocular movements were slow. She had a hard time following my finger but she denied  any subjective diplopia.   The patient seemed to have mild ptosis on the left, more compared to the right.  But it does not affect her visual field.  The patient seemed to have full visual fields but decreased visual acuity.   Her face was symmetric. Tongue was midline. Facial sensations were intact. She is wearing dentures.   MOTOR:  She seems to have 5/5 strength of her bilateral upper extremities. She has a 4+/5 strength of bilateral hip flexion, abduction and adduction but her knee extension, flexion and dorsiflexion of her ankle and plantarflexion seems to be 5/5.  In one breath, she can count up to 20.   Her NIF (negative inspiratory force) was 30.  SENSORY:  Intact to light touch. Deep tendon reflexes were 2+. Her ankle jerks were absent.   I did not make her walk in the Intensive Care Unit.   ASSESSMENT AND PLAN: Shortness of breath, which is thought to be due to myasthenia gravis, which I think is very less likely. As the patient's initial presenting symptoms were more pelvic girdle weakness and ptosis and patient does not feel like her symptoms are getting worse.   The patient's shortness of breath can be explained by her anemia with a hemoglobin of around 8 with diastolic congestive heart failure and pulmonary hypertension, volume overload due to her acute on chronic renal failure, etc.   Unfortunately, the patient is not able to tolerate higher dose of pyridostigmine either with significant bradycardia and hypotension.   Right now her pyridostigmine is on hold, but it can be resumed when it is safe from cardiopulmonary perspective.  Currently she is on long-term immunosuppression with prednisone 10 mg every day. In the future, she should be taking 10 mg every other day which seems to as efficacious with the less side effects.   I would like to check her acetylcholine receptor antibody panel, as well as ESR.   I talked with covering physician on the phone and we did discuss  about avoiding medications which can make myasthenia gravis worse, including calcium channel blocker of fluoroquinolones.   If for some reason the patient has to get intubation, we should avoid trying to giving her any neuromuscular blocking agents as much as possible as she might have a very prolonged recovery from it compared to other people.   The patient was also explained my findings.   I do not think the patient has botulism or tick paralysis or Lambert-Eaton myasthenic syndrome.   I talked to a respiratory therapist and made sure we check her NIF, negative inspiratory force, every 6 hours. We should have a low threshold to start her on BiPAP if she starts having worsening of her breathing problems. We should send a note to Dr. Jannifer Franklin at [facility] who is her regular neurologist.   Feel free to contact me with any further questions. I will follow this patient on a p.r.n. basis.      ____________________________ Royetta Crochet. Manuella Ghazi, MD hks:ce D: 11/26/2012 11:43:56 ET T: 11/26/2012 15:22:28 ET JOB#: [id number]  cc: Esau Fridman K. Manuella Ghazi, MD, <Dictator> Royetta Crochet [facility] MD ELECTRONICALLY SIGNED 11/29/2012 19:47

## 2014-12-07 NOTE — Consult Note (Signed)
Brief Consult Note: Diagnosis: Lower GI bleed.   Patient was seen by consultant.   Consult note dictated.   Comments: Probable diverticular bleed. No signs of active bleeding and she is hemodynamically stable. No clinical evidence of diverticulitis although CT suggests posible diverticulitis.  Recommendations: Follow H and H. Clear liquid diet. If signs of active GI bleed please obtain stst bleeing scan and vascular surgery consult. Will follow.  Electronic Signatures: Lurline DelIftikhar, Delmont Prosch (MD)  (Signed 04-Apr-14 12:46)  Authored: Brief Consult Note   Last Updated: 04-Apr-14 12:46 by Lurline DelIftikhar, Aissa Lisowski (MD)

## 2014-12-08 NOTE — Consult Note (Signed)
PATIENT NAME:  [name] Duffy, [name] Duffy MR#:  [id number] DATE OF BIRTH:  [birth year]  DATE OF CONSULTATION:  02/23/2014  CONSULTING PHYSICIAN:  Pauletta BrownsYuriy Zamari Vea, MD  REASON FOR CONSULTATION:  History of myasthenia gravis.  HISTORY OF PRESENT ILLNESS:  This 81 year old African American female with past medical history significant for multiple medical problems including recent admission last month, for acute on chronic renal failure, hypertension, anemia, status post transfusion, admitted to the hospital complaining of feeling of fatigue and being hypotensive and generalized weakness. The patient was found to have right lower lobe pneumonia and was started on antibiotics. The patient has a history of myasthenia gravis. She is on pyridostigmine at home p.r.n. When questioned about myasthenia, was diagnosed long time ago. The patient states that she has never had myasthenic crisis, was never intubated. Currently, states the generalized weakness is getting better, but still not at baseline.   PAST MEDICAL HISTORY: Significant for chronic renal failure, hypertension, history of anemia, hypothyroidism, myasthenia, history of atrial fibrillation, diastolic heart failure, gastroesophageal reflux disease.   HOME MEDICATIONS: Amiodarone, amlodipine, Docusate, donepezil, iron sulfate, furosemide, hydralazine, levothyroxine, omeprazole, potassium, pyridostigmine 30 mg 4 times a day.   SOCIAL HISTORY: Former smoker. No EtOH or drug use.   FAMILY HISTORY: Both parents had congestive heart failure, history of stroke in the family.   REVIEW OF SYSTEMS:  Currently no shortness of breath. No chest pain, states has generalized weakness and some fatigue. Denies any cough, wheezing. Denies any palpitations. Denies any nausea. Denies any vomiting. Denies any dysuria or hematuria. Denies any arthritis. No weakness on one side of the body compared to the other. Denies any history of depression.   PHYSICAL EXAMINATION: VITAL SIGNS:  Temperature of 98.6, pulse 55, respirations 17, blood pressure 133/48.   NEUROLOGICAL: The patient is awake, alert and oriented to her name, the facility and the month. Speech appears to be fluent. No dysarthria or aphasia seen.  Pupils equal, round, and reactive. Visual fields intact. Facial sensation intact. Facial motor is intact. Motor strength appears to be 4+/5 bilateral upper and lower extremities. Reflexes are diminished. Sensation intact to light touch and temperature. Coordination: Finger-to-nose intact.   Laboratory workup and imaging have been reviewed.   IMPRESSION: An 81 year old female presenting with generalized weakness, fevers, found to have right lower lobe pneumonia on antibiotics. The patient also has an atrial fibrillation that is not rate controlled.  In terms of myasthenia gravis, the patient has been diagnosed with myasthenia a long time ago on pyridostigmine 30 mg every 4 hours or as needed. The patient has never had episodes of myasthenic crisis, was never intubated because of myasthenia as per patient.   PLAN: I would not do any further treatments for myasthenia at this point. We can restart her pyridostigmine at home, as there is limited role of it in the hospital, as it mainly increases secretions.  I think pneumonia is still a contributing factor here. The patient does have a generalized weakness, but it has improved. I do not think the patient is anywhere near myasthenic crisis. Respiratory rate and respiratory function appears to be within normal limits.  I  would not recommend any treatment for myasthenia that would include IVIG or plasmapheresis at this point, supportive therapy.   Thank you, it was a pleasure seeing this patient.    ____________________________ Pauletta BrownsYuriy Valori Hollenkamp, MD yz:ds D: 02/23/2014 19:57:38 ET T: 02/23/2014 22:35:43 ET JOB#: [id number]  cc: Pauletta BrownsYuriy Dorleen Kissel, MD, <Dictator> Pauletta BrownsYURIY Tillman Kazmierski MD ELECTRONICALLY SIGNED 03/20/2014 21:10

## 2014-12-08 NOTE — Discharge Summary (Signed)
PATIENT NAME:  [name], [name] Duffy DATE OF BIRTH:  10-28-1931  DATE OF ADMISSION:  03/24/2014 DATE OF DISCHARGE:  03/27/2014  PRESENTING COMPLAINT: Weakness and altered mental status.   DISCHARGE DIAGNOSES:  1. Acute on chronic renal failure due to poor p.o. intake. Chronic kidney disease, stage III.  2. Severe malnutrition.  3. History of hypertension.   CONDITION ON DISCHARGE: Guarded.   CODE STATUS: No code, DO NOT RESUSCITATE.   MEDICATIONS:  1. Docusate 200 mg p.o. b.i.d.  2. Levothyroxine 50 mcg p.o. daily.  3. Pyridostigmine 60 mg 1/2 tablet 4 times a day. 4. Felodipine 5 mg daily.  5. Ferrous sulfate 325 mg p.o. b.i.d.  6. Omeprazole 20 mg b.i.d.  7. Donepezil 10 mg at bedtime.  8. Ensure Plus 237 mL p.o. t.i.d.  9. Amiodarone 100 mg daily.  10. Spiriva 18 micrograms inhalation daily.  11. Hydralazine 25 mg p.o. b.i.d.  12. Remeron 7.5 mg at bedtime.  13. Sulfacetamide sodium ophthalmic drops every 6  hours for conjunctivitis. The patient advised not to take Lasix and K-Dur.  14. Physical therapy nurse and nurse aide on [facility] has been set up. 15. Followup with Dr. Darreld McleanLinda Miles in 1 to 2 weeks.  16. Creatinine at discharge is 2.33. Potassium is 4.5.  17. Palliative care consultation, Dr. Harvie JuniorPhifer.   BRIEF SUMMARY OF HOSPITAL COURSE: [name] is an 81 year old African-American female with multiple chronic comorbid medical condition presents with generalized weakness, found to have acute on chronic renal failure. She was admitted with:   1. Acute on chronic renal failure, likely exacerbated by poor p.o. intake. Her chest x-ray did not seem to indicate abnormality. Creatinine improved from 3.26 to 2.33 after IV hydration. She is mentating better, eating okay.  2. Left eye conjunctivitis, started on antibiotic drops for 7 days.  3. Hypertension seems to be controlled. Medications have been adjusted.  4. Hypothyroidism.  5. Myasthenia gravis. Continue her  home medications.   CODE STATUS: The patient remained a DO NOT RESUSCITATE. I spoke with the patient's sister, [name].  She appreciated a palliative care consultation. The patient is agreeable to return home with [facility] has been set up.   CODE STATUS: REMAINS NO CODE, DO NOT RESUSCITATE.   TIME SPENT: 40 minutes.    ____________________________ Wylie HailSona A. Allena KatzPatel, MD sap:JT D: 03/27/2014 15:16:55 ET T: 03/27/2014 16:41:33 ET JOB#: [id number]  cc: Fatemah Pourciau A. Allena KatzPatel, MD, <Dictator> Willow OraSONA A Deagan Sevin MD ELECTRONICALLY SIGNED 03/28/2014 13:53

## 2014-12-08 NOTE — Discharge Summary (Signed)
PATIENT NAME:  [name] Duffy, [name] Duffy MR#:  [id number] DATE OF BIRTH:  [birth year]  DATE OF ADMISSION:  12/28/2013  DATE OF DISCHARGE:  12/30/2013  ADMISSION DIAGNOSIS:  Myasthenia gravis crisis.   DISCHARGE DIAGNOSES:  1.  Weakness and dysphasia, suspect myasthenia gravis crisis.  2.  Chronic diastolic heart failure.  3.  Hypertension. 4.  Chronic kidney disease.  5.  Chronic anemia.   CONSULTATIONS: Neurology.   PERTINENT LABORATORIES AT DISCHARGE: Sodium 142, potassium 4.6, chloride 110, bicarb 31, BUN 32, creatinine 1.75, glucose 81. White blood cells 9, hemoglobin 9, hematocrit 27, platelets 170.   HOSPITAL COURSE: This is an 81 year old female who presented with weakness and dysphasia. She his history of myasthenia. Her NIF was -30 on admission. She is suspected to have a myasthenia gravis crisis. For further details, please refer to the H and P.   1.  Myasthenia gravis crisis. The patient is admitted was dysphagia, increased oral secretions, and weakness, suspected to be a myasthenic crisis. Neurology was consulted. She received Decadron in the ER. She was on IVIG x 3 days due to her renal insufficiency and has improved. She will resume her outpatient medications.   2.  Chronic diastolic heart failure. The patient was euvolemic.   3.  Chronic kidney disease. Her creatinine seems stable.   4.  Hypertension. She will resume her outpatient medications.   5.  Anemia, chronic in nature. This is likely secondary to her chronic disease.   DISCHARGE MEDICATIONS: 1.  Amiodarone 200 mg daily.  2.  Docusate 100 mg b.i.d.  3.  Ferrous sulfate 325 daily.  4.  Lasix 40 mg daily.  5.  Synthroid 50 mcg daily.  6.  Daily multivitamin 1 tablet daily.  7.  Hydralazine 100 mg t.i.d.  8.  Prednisone 2.5, 3 tablets at 12:00 p.m.  9.  Pyridostigmine 60 mg 1/2 tablet 4 times a day.  10.  Omeprazole 20 mg b.i.d.  11.  Lidocaine viscous 5 mL 4 times a day.  12.  Benzoate 100 mg t.i.d.  13.  Norvasc  5 mg daily.  14.  Ensure t.i.d.   INSTRUCTIONS: Discharge diet: Regular diet. Discharge supplement: Ensure t.i.d. Discharge home with home health, physical therapy, nurse and nurse aide. Discharge followup:  The patient will follow up with Darreld McleanLinda Miles in one week.  Time spent:  35 minutes.   The patient is stable for discharge.    ____________________________ Mariona Scholes P. Juliene PinaMody, MD spm:mr D: 12/30/2013 12:38:34 ET T: 12/30/2013 19:46:30 ET JOB#: [id number]  cc: Belmont Valli P. Juliene PinaMody, MD, <Dictator> Leanna SatoLinda M. Miles, MD  Janyth ContesSITAL P Bryson Palen MD ELECTRONICALLY SIGNED 12/31/2013 12:18

## 2014-12-08 NOTE — Discharge Summary (Signed)
PATIENT NAME:  [name] Duffy, [name] MR#:  [id number] DATE OF BIRTH:  Aug 13, 1932  DATE OF ADMISSION:  10/30/2013 DATE OF DISCHARGE:  10/31/2013  PRIMARY CARE PHYSICIAN:  Dr. Darreld McleanLinda Miles.  PROCEDURE:  EGD.  CONSULTATION:  GI, Dr. Servando SnareWohl.   DISCHARGE DIAGNOSES: 1. 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  2.  Anemia.  3.  Hypertension.  4.  Chronic diastolic congestive heart failure.  5.  Chronic respiratory failure, on home oxygen.  6.  Gastroesophageal reflux disease.  7.  History of atrial fibrillation.   CONDITION: Stable.   CODE STATUS: FULL CODE.   HOME MEDICATIONS: [facility] medication reconciliation list. Continue home medications. No new medications.   The patient needs to continue home oxygen 2 liters by nasal cannula.   DIET: Low sodium diet.   ACTIVITY: As tolerated.   FOLLOWUP CARE: Follow up with PCP within 1 to 2 weeks. Follow up with Dr. Servando SnareWohl within 1 to 2 weeks.   REASON FOR ADMISSION: Bright red blood per rectum.   HOSPITAL COURSE: The patient is an 81 year old female with a history of atrial fib, not on  anticoagulation, diastolic congestive heart failure, hypertension, gastroesophageal reflux disease, and was brought to the ED due to bright red blood per rectum of 2 episodes.  The patient has a history of lower GI bleeding due to diverticula cause  last year.  The patient's hemoglobin was noticed to be dropped from 9.9 to 8.8. For detailed history and physical examination, please refer to the admission note dictated by Dr. Randol KernElgergawy. Laboratory  data on admission date showed BUN 32, creatinine 1.72, sodium 141, potassium 4.3, chloride 109, hemoglobin 8.8, troponin less than 0.02. 1.  Gastrointestinal bleeding with anemia. After admission, the patient had been treated with Protonix IV b.i.d. and kept n.p.o. Dr. Servando SnareWohl evaluated the patient and  suggested EGD. He had EGD yesterday which showed 2 bleeding angiodysplastic lesions in the stomach and a diverticulum in the lower third of the esophagus. Dr. Servando SnareWohl suggested medical treatment. The patient's hemoglobin decreased to 7.8 yesterday, but increased to 8.3 today. The patient has no more bloody stool or melena. She has no complaints. She is clinically stable and will be discharged to home today. I discussed the patient's discharge plan with the patient, nurse, case manager and Dr. Servando SnareWohl.   TIME SPENT: About 36 minutes.   ____________________________ Shaune PollackQing Jillien Yakel, MD qc:dmm D: 10/31/2013 15:36:54 ET T: 10/31/2013 20:07:24 ET JOB#: [id number]  cc: Shaune PollackQing Annsleigh Dragoo, MD, <Dictator> Shaune PollackQING Emmie Frakes MD ELECTRONICALLY SIGNED 11/01/2013 18:33

## 2014-12-08 NOTE — Discharge Summary (Signed)
PATIENT NAME:  [name] Duffy, [name] Duffy MR#:  [id number] DATE OF BIRTH:  1932-07-03  DATE OF ADMISSION:  02/09/2014 DATE OF DISCHARGE:  02/12/2014  ADMITTING DIAGNOSES: 1. Generalized weakness.  2. Acute renal failure.   DISCHARGE DIAGNOSES: 1. Acute-on-chronic renal failure due to acute tubular necrosis; suspected to have hypertension.  2. Acute-on-chronic anemia of chronic disease status post 1 unit of packed red blood cell transfusion.  3. History of hypertension.  4. Hypothyroidism.  5. Myasthenia gravis.  6. History of atrial fibrillation in sinus rhythm now.  7. Generalized weakness due to severe anemia.  8. Dehydration.  9. Diastolic chronic congestive heart failure stable.  10. Gastroesophageal reflux disease.  11. History of bradycardia secondary to beta blockers.  12. Bilateral bacterial conjunctivitis.  DISCHARGE CONDITION: Stable.   DISCHARGE MEDICATIONS: The patient is to continue:  1. Amiodarone 200 mg p.o. daily.  2. Docusate sodium 100 mg p.o. twice daily.  3. Iron sulfate 325 mg p.o. daily.  4. Furosemide 40 mg p.o. daily.  5. Levothyroxine 50 mcg p.o. daily.  6. Multivitamins once daily.  7. Hydralazine 100 mg p.o. 3 times daily.  8. Prednisone 2.5 mg 3 tablets once daily.  9. Pyridostigmine 60 mg one-half tablet 4 times daily.  10. Viscous lidocaine 2% mucous membrane solution, 5 mL to mucous membranes 4 times daily as needed.  11. Benzonatate 100 mg p.o. 3 times daily.  12. Amlodipine 5 mg p.o. daily.  13. Ensure 240 mg 3 times daily with meals.  14. Gentamicin 1 drop to each affected eye every 4 hours as needed.  15. Omeprazole 40 mg p.o. twice daily.    HOME OXYGEN: None.   DIET: 2 grams salt, low-fat, low-cholesterol, regular consistency.   ACTIVITY LIMITATIONS: As tolerated.   REFERRAL: To home health physical therapy RN as well as Child psychotherapist.    FOLLOWUP APPOINTMENT: With Dr. Marvis Moeller in 2 days after discharge, [facility] nephrology in 2 days after discharge  as well as GI on-call in 2 or 3 days after discharge to evaluate this patient with severe anemia.    CONSULTANTS: Care management, social work, physical therapy, Dr. Cherly Hensen, nephrologist at [facility].   RADIOLOGIC STUDIES:  Chest x-ray, one view, 2014-03-04, revealed stable cardiomegaly, stable changes of pleural parenchymal scarring in the left base. No acute cardiopulmonary disease identified.  Ultrasound of kidneys, bilateral, on 02/09/2014, showed increased renal cortical echogenicity bilaterally, as can be seen with medical renal disease. No obstructive uropathy, cholelithiasis. Suboptimal visualization of the gallbladder secondary to the contracted state. Trace amount of perihepatic ascites was also noted.   The patient is an 81 year old African American female with past medical history significant for history of multiple medical problems including myasthenia gravis, history of chronic renal failure, who presents to the hospital with hypotension, weakness, as well as acute-on-chronic renal failure. Please refer to Dr. Clarita Leber admission note on 02/09/2014. On arrival to the hospital, the patient's temperature was 98.7, pulse was 59, respiration rate was 16, blood pressure 139/51. Saturation was 100% on room air. Physical exam was unremarkable. The patient's lab data done on arrival, 2014-03-04, showed BUN and creatinine elevated at 39 and 2.56, potassium of 5.6; otherwise, BMP was unremarkable. The patient's estimated GFR for African American would be 17. Liver enzymes were normal except an albumin level was 2.9. Cardiac enzymes, one set, were normal. White blood cell count was normal at 10.9, hemoglobin was 9.3, platelet count 185,000. The patient's urinalysis showed 9 red blood cells, 5 white blood cells, trace  bacteria, otherwise unremarkable. The patient's ABGs were done on 28% FiO2, showed pH of 7.37, pCO2 was 50, pO2 was 128, saturation was 98.8 with bicarbonate level of 28.9. EKG showed sinus  bradycardia at 57 beats per minute, left axis deviation. No acute ST-T changes were noted. No significant change since prior EKG done in May 2015. Patient's chest x-ray was unremarkable, as well as ultrasound. The patient was admitted to the hospital for further evaluation. She was given IV fluids, and her condition somewhat improved. Her kidney function also improved as time progressed and creatinine went down from 2.56 on the day of admission to 1.95 on the day of discharge, 02/12/2014, which was patient's baseline. Estimated GFR went up from 17 on the day of admission to 24 on the day of discharge. It was felt that the patient was dehydrated and possibly could be related also to hypertension-related ATN. The patient is [facility] for further recommendations. For dehydration, however, the patient was noted to be severely anemic, hemoglobin dropped down to 6.7 from the admission level 9.3 on 02/08/2014. The patient had iron studies done while she was in the hospital and iron level was found to be 20. The patient's iron saturation 13, ferritin level was 161. It was felt that the patient's anemia was very likely multifactorial, but likely anemia of chronic disease. The patient was transfused with 1 unit of packed red blood cells, after which her hemoglobin level improved to 8.2 on 02/11/2014, and 9.4 on 02/12/2014. The patient is to follow up with her nephrologist for further recommendations, but we felt that the patient would benefit from Epogen injections intermittently. She is also to follow up with gastroenterologist as an outpatient, and to further investigate her for this severe anemia to rule out gastrointestinal source of bleed. Guaiac of stool was ordered; however never received during the time the patient was in the hospital. In regards to history of hypertension, the patient's blood pressure medications were placed on hold while she was in the hospital and rehydrated; however, on  the day of discharge, the patient's blood pressure medications were resumed. It is recommended to follow the patient's blood pressure readings and make decisions about holding some of her blood pressure medications if needed. On the day of discharge, 02/12/2014, the patient's blood pressure is fluctuating between of 129 systolic to 161 systolic and 60s to 70s diastolic. Oxygen saturation was 94% to 98% on 2 liters of oxygen through nasal cannula at rest. In regards to hypothyroidism, patient's TSH was not checked while she was in the hospital. During this admission, her weight was checked in October 2014. At that time patient's, TSH was found to be high at 12.1. However, the patient's free thyroxine was normal at 0.9. It is recommended to follow patient's TSH levels as outpatient and make decisions about advancement of her at Synthroid dose as needed. In regards to myasthenia gravis, the patient was evaluated by a neurologist in the recent past. No changes were made in her medication management. The patient is to continue steroids as well as pyridostigmine, at the doses she usually used at home. In order to protect her stomach, she is to continue also proton pump inhibitor. In regards to atrial fibrillation, the patient is to continue amiodarone. She was in sinus rhythm on the day of admission and during her stay in the hospital time. For generalized weakness, the patient was evaluated by physical therapist and was recommended to have home health physical therapy;  however, she was reevaluated and it was felt that she would need 24-hour assistance. The patient told me that she has a sister who lives with her at home and she would like to return back home. She is going to be discharged with home health physical therapy as well as RN as well as Child psychotherapist to follow her at home. In regards to dehydration, patient was advised to drink plenty of fluids. For her chronic diastolic congestive heart failure, no changes  were made. The patient is to continue her usual outpatient management. It is recommended to follow the patient's kidney function tests, especially since she is back on diuretics. In regards to gastroesophageal reflux disease, the patient is to continue PPIs as mentioned above. The patient is being discharged in stable condition with the above-mentioned medications and follow-up. On the day of discharge, temperature was 97.8, pulse was 58, respiration rate was 18, blood pressure 129/57, saturation was 94% to 98% on 2 liters of oxygen through nasal cannula at rest.   TIME SPENT: 40 minutes.    ____________________________ Katharina Caper, MD rv:lm D: 02/12/2014 17:18:35 ET T: 02/13/2014 05:26:01 ET JOB#: [id number]  cc: Katharina Caper, MD, <Dictator> Leanna Sato, MD GI on call  Katharina Caper MD ELECTRONICALLY SIGNED 03/05/2014 16:12

## 2014-12-08 NOTE — Consult Note (Signed)
PATIENT NAME:  [name] Duffy, [name] Duffy MR#:  [id number] DATE OF BIRTH:  12-19-1931  DATE OF CONSULTATION:  12/28/2013  REFERRING PHYSICIAN:   CONSULTING PHYSICIAN:  Pauletta BrownsYuriy Hiedi Touchton, MD  REASON FOR CONSULTATION:  Myasthenia.  HISTORY OF PRESENT ILLNESS: An 81 year old female with past medical history diastolic CHF, hypertension, hypothyroidism, GERD, myasthenia, atrial fibrillation, on anticoagulation,  presents with increased oral secretions and difficulty swallowing and generalized weakness.  NIF was -30. Suspected that patient had myasthenic crisis with bulbar involvement, status post 10 IV of Decadron in the emergency department. The patient was also started on IVIG. Current symptoms appeared to be improving.  PAST MEDICAL HISTORY:  Diastolic congestive heart failure, hypertension, hypothyroidism, GERD, myasthenia gravis, atrial fibrillation.   HOME MEDICATIONS: Include prednisone daily, Tylenol, Mestinon, amiodarone, benztropine, Norvasc, Lasix, ferrous sulfate, docusate, amoxicillin, omeprazole, levothyroxine, hydralazine, multivitamin.   SOCIAL HISTORY: No smoking. No alcohol use. No illicit drug use.   FAMILY HISTORY: Both parents have congestive heart failure.   REVIEW OF SYSTEMS:  Generalized fatigue. Denies tinnitus or ear pain. Denies any chest pain. Denies nausea or vomiting. Reports dysphagia and some difficulty swallowing. Denies any anemia or easily bruising. No history of stroke or TIA. The patient's laboratory workup has been reviewed.   PHYSICAL EXAMINATION:  VITAL SIGNS: Temperature of 97.8, respirations 20, heart rate from 50%-64%, blood pressure 146/100.  NEUROLOGICAL: The patient is alert, awake and oriented to time, place, able to tell me her full name. Extraocular movements intact. The patient was able to count on one breath to 20. NIF is apparently improving. Facial sensation intact. Tongue is midline. Uvula elevates symmetrically. Shoulder shrug intact. Leg weakness 4/5,  flexion and extension. Motor is 5/5 bilateral upper and lower extremities. Reflexes symmetrical. Sensation intact.   IMPRESSION: An 81 year old female with history of myasthenia, atrial fibrillation, admitted with dysphagia and increased oral secretions, suspected to be myasthenic crisis. At this point, the patient's symptoms have improved. The patient is status post Decadron in the Emergency Department.   PLAN: Currently, there is no role of acute steroids specifically but with dose of Decadron for a myasthenic crisis.  Patient's Solu-Medrol has been discontinued. For acute myasthenic crisis, Mestinon should be discontinued as well because of increased oral secretions. It was discontinued by me.  I agree with IVIG.  I would complete at least 3 treatments and do further evaluations. The patient is getting stronger and NIF has improved.  Her swallowing has improved.  NIF vital capacity daily.    ____________________________ Pauletta BrownsYuriy Azaria Bartell, MD yz:dd D: 12/28/2013 19:45:11 ET T: 12/28/2013 20:09:48 ET JOB#: [id number]  cc: Pauletta BrownsYuriy Takoda Siedlecki, MD, <Dictator> Pauletta BrownsYURIY Bristal Steffy MD ELECTRONICALLY SIGNED 01/15/2014 18:42

## 2014-12-08 NOTE — H&P (Signed)
PATIENT NAME:  [name] Duffy, [name] Duffy MR#:  [id number] DATE OF BIRTH:  1932-02-06  DATE OF ADMISSION:  02/09/2014  PRIMARY CARE PHYSICIAN: Dr. Darreld Mclean   REFERRING PHYSICIAN: Dr. Janalyn Harder  CHIEF COMPLAINT: Generalized weakness, low blood pressure.   HISTORY OF PRESENT ILLNESS: This patient is an 81 year old female with a known history of myasthenia gravis on chronic steroids  .  Has been feeling weak for the last 3 weeks. Concerning this, primary care physician arranged the home health with physical therapy. The patient has been advancing to the point that patient was able to walk to the bathroom by herself.  Patient's condition gradually worsened in the last 3-4 days with increased confusion with generalized weakness.  As the patient's blood pressure was on the lower side, EMS was called and was brought to the Emergency Department.   WORKUP IN THE EMERGENCY DEPARTMENT:  Patient is found to have elevated BUN and creatinine from the baseline of 1.5. Unable to obtain any history from the patient secondary to dementia. Denied having any pain in any part of the body.  Per family, no history of any fever.   PAST MEDICAL HISTORY:  1. Diastolic congestive heart failure.  2. Hypertension.  3. Hypothyroidism.  4. Gastroesophageal reflux disease.  5. Myasthenia gravis.  6. Atrial fibrillation not on any anticoagulation.  7. Bradycardia due to beta blockers.   ALLERGIES: CRESTOR.   HOME MEDICATIONS:  1. Pyridostigmine 30 mg 4 times a day.  2. Prednisone 7.5 mg once a day.  3. Omeprazole 20 mg 2 times a day.  4. Lidocaine viscous mucous membranes as needed.  5. Levothyroxine 50 mcg once a day.  6. Hydralazine 100 mg 3 times a day.  7. Lasix 40 mg once a day.  8. Ferrous sulfate 325 mg once a day.  9. Docusate sodium 100 mg 2 times a day.   SOCIAL HISTORY: Was a former smoker. No history of alcohol and drug use.  Lives with her sister.   FAMILY HISTORY: Both parents had congestive heart  failure and history of CVAs in the family.   REVIEW OF SYSTEMS: Could not be obtained secondary to patient's confusion.   PHYSICAL EXAMINATION:  GENERAL: This is a well built, well nourished female lying down in the bed, not in distress, looks lethargic.  VITAL SIGNS: Temperature 98.7, pulse 59, blood pressure 139/51, respiratory rate of 16, oxygen saturation 100% on room air.  HEENT: Head normocephalic, atraumatic.  No scleral icterus. Conjunctivae normal. Pupils equal and reactive. Extraocular movements are intact. Mucous membranes: Mild dryness. No pharyngeal erythema.  NECK: Supple. No lymphadenopathy. No JVD. No carotid bruit. No thyromegaly.  CHEST: Has no focal tenderness.  LUNGS: Bilaterally clear to auscultation.  HEART: S1, S2 regular. No murmurs are heard.  ABDOMEN: Bowel sounds present. Soft, nontender, nondistended. No hepatosplenomegaly.  EXTREMITIES: No pedal edema. Pulses 2+.  SKIN: No rash or lesions.  MUSCULOSKELETAL: Good range of motion in all the extremities.  NEUROLOGIC: The patient is oriented to self and person but not to place and time. No apparent cranial nerve abnormalities. Motor 5/5 in upper and lower extremities.   LABORATORY DATA:  Complete metabolic panel: BUN 39, creatinine of 2.56. The rest of all the values are within normal limits. CBC is completely within normal limits except for hemoglobin of 9.3. UA negative for nitrites and leukocyte esterase.   ASSESSMENT AND PLAN: This patient is an 81 year old female who is brought to the Emergency Department for generalized weakness and acute  renal failure.  1. Generalized weakness. Most likely secondary to dehydration and hypotension.  Hold the Lasix and hydralazine for now. Continue with intravenous fluids. No obvious signs of any infection is seen.  2. Acute renal failure. Most likely secondary to overdiuresis and possible hypotension caused to have acute tubular necrosis. As mentioned above, held Lasix and  hydralazine and will follow up.  3. Debility. We will involve physical therapy, occupational therapy.  4. Myasthenia gravis. Continue with pyridostigmine.  5. Hypothyroidism. Continue Synthroid.  6. Keep the patient on deep vein thrombosis prophylaxis with Lovenox.   TIME SPENT: 55 minutes.    ____________________________ Susa GriffinsPadmaja Kamani Magnussen, MD pv:dd D: 02/09/2014 01:04:00 ET T: 02/09/2014 03:56:59 ET JOB#: [id number]  cc: Susa GriffinsPadmaja Jery Hollern, MD, <Dictator> Leanna SatoLinda M. Miles, MD Susa GriffinsPADMAJA Katanya Schlie MD ELECTRONICALLY SIGNED 02/21/2014 21:51

## 2014-12-08 NOTE — H&P (Signed)
PATIENT NAME:  [name] Duffy, [name] MR#:  [id number] DATE OF BIRTH:  1932/01/22  DATE OF ADMISSION:  03/24/2014  PRIMARY CARE PHYSICIAN:  [name] SatoLinda M. Miles, MD.  HISTORY OF PRESENT ILLNESS:  This is an 81 year old African American female with past medical history significant for chronic renal failure, hypertension, anemia, myasthenia gravis, hypothyroidism, atrial fibrillation and congestive heart failure.  She was recently discharged from her facility on July 27, after her stay for an acute pneumonia.  She was sent to [facility] where she resided for nearly a month and was just discharged home about a week ago.  Per her sister, who is her caregiver and lives with her, she was doing significantly better at home towards the beginning of the week, but about 3 or 4 days ago, began to show signs of being weaker, generalized weakness, not being able to move as much, needing more assistance getting around the house, and then about 3 days ago, developed left eye redness with some discharge and then over the last 24 hours, was needing complete assistance to get up, having to be picked up.  Was not able to get up and mobilize herself at all.  Her sister also states that she has had a little bit of a cough.  She was recently admitted here last month and treated for pneumonia and was also given another course of antibiotics at the rehabilitation facility where she was discharged to for pneumonia as well.  On workup here in the ED, she was found to have acute on chronic renal failure with her creatinine increasing today to 3.2 with a BUN of 41.  She is also more lethargic than normal per her sister's report.  At that time, hospitalist services were contacted for admission.  PAST MEDICAL HISTORY:  Chronic renal failure, hypertension, hypothyroidism, myasthenia gravis, history of atrial fibrillation, diastolic congestive heart failure, gastroesophageal reflux disease, dementia.  ALLERGIES:  ONLY  CRESTOR.  HOME MEDICATIONS:  Amiodarone 100 mg p.o. daily, amlodipine 5 mg p.o. daily, docusate sodium 100 mg p.o. twice daily, donepezil 10 mg p.o. daily at bedtime, iron sulfate 325 mg twice daily, furosemide 40 mg p.o. daily, hydralazine 100 mg p.o. twice daily, levothyroxine 50 mcg p.o. daily, omeprazole 20 mg p.o. twice daily, potassium chloride 10 mEq once daily, pyridostigmine 30 mg 4 times daily, Spiriva 18 mcg inhalation capsule, 1 capsule inhaled once daily.    SOCIAL HISTORY:  Former smoker.  No history of alcohol or drug abuse.  Lives with her sister who is primary caregiver.  FAMILY HISTORY:  Both parents congestive heart failure, history of stroke in her family as well.  REVIEW OF SYSTEMS:   CONSTITUTIONAL:  The patient endorses weakness.  Denies blurry vision.  Endorses pain in her left knee.  Denies chills.  Denies overt fever. ENT:  Denies any tinnitus.  Denies difficulty swallowing. RESPIRATORY:  Endorses cough, productive of scant yellowish sputum.  Denies wheezes. CARDIOVASCULAR:  Denies chest pain.  Denies edema.  Denies palpitations. GASTROINTESTINAL:  Endorses 1 episode of nausea with vomiting of clear emesis about 6 days ago.  Denies constipation.  Denies diarrhea.  Denies abdominal pain. GENITOURINARY:  Denies dysuria, hematuria or frequency. SKIN:  Denies rash, lesions. MUSCULOSKELETAL:  Complains of left knee pain and mild swelling, as well as some left elbow swelling without pain. NEUROLOGIC:  Denies any numbness or tingling.  Denies any focal deficits. PSYCHIATRIC:  Denies anxiety.  Denies insomnia.  PHYSICAL EXAMINATION: VITAL SIGNS:  Temperature 97.9, pulse 66,  respirations 20, blood pressure 105/55, pulse oximetry 96% on 1 liter oxygen via nasal cannula. GENERAL:  This is a frail-appearing, thin African American female in no significant distress lying on the stretcher. HEENT:  Pupils are equal, round and reactive to light.  Extraocular movements intact.  The  patient does also have left eye redness and purulent discharge. NECK:  Supple with no cervical lymphadenopathy. RESPIRATORY:  Clear to auscultation bilaterally with no wheezes, rales or rhonchi. CARDIOVASCULAR:  S1, S2.  She has 3/6 early systolic murmur with no rubs or gallops.  No peripheral edema. GASTROINTESTINAL:  Soft, nontender, nondistended.  Positive bowel sounds. MUSCULOSKELETAL:  No cyanosis or clubbing.  Passive full range of motion and spontaneous movement in all extremities. NEUROLOGIC:  The patient is alert and oriented, following commands and has no acute focal deficit, though she does have generalized  weakness. SKIN:  No rashes or lesions noted on exam.  Skin is dry and intact. PSYCHIATRIC:  The patient is somewhat confused.  She is able to participate in exam but is unable to give complete answers to all questions.  Sister provides collateral information.  LABORATORY DATA:  White count 10, hemoglobin 8, hematocrit 24.8, platelet count 194,000.  Sodium 140, potassium 5.1, chloride 106, bicarbonate 28, anion gap is 6.  Creatinine 3.2, BUN 41, glucose 64, calcium 9.4, total protein 6.4, albumin 2.4, bilirubin 0.4, alkaline phosphatase 55.  AST 12, ALT 12.  Troponin less than 0.02.  UA showed a specific gravity of 1.010, negative blood, negative protein, negative nitrites, negative leukocyte esterase.  RADIOLOGY DATA:  Chest x-ray done showed stable COPD and cardiomegaly with no acute infiltrate.  ASSESSMENT AND PLAN:  This is an 81 year old African American female with multiple chronic comorbid medical conditions presenting here with generalized weakness and found to have acute on chronic renal failure. 1.  Acute on chronic renal failure, likely exacerbated by poor p.o. intake.  Unclear if there is some ongoing infection.  Her chest x-ray did not seem to indicate a pneumonia.  We will hydrate her, monitor her creatinine and consider renal consult if she has no improvement in the  morning. 2.  Left eye conjunctivitis with purulent discharge.  We have ordered sulfacetamide drops for 7 days. 3.  Hypertension.  This seems to be controlled.  We will continue her home medications at this time. 4.  Hypothyroidism.  We are checking a TSH and will continue home replacement at this time. 5.  Myasthenia gravis.  We will continue her home medications for this at this time, as it seems to be stable currently. 6.  Diverticulosis.  With a history of diverticular bleed, we have left this patient on twice daily pantoprazole for this.  CODE STATUS:  DNR.  TIME SPENT ON THIS PATIENT:  50 minutes.    ____________________________ Candace Cruise. Anne Hahn, MD dfw:ds D: 03/24/2014 15:43:20 ET T: 03/24/2014 16:04:46 ET JOB#: [id number]  cc: Candace Cruise. Anne Hahn, MD, <Dictator> Coley Littles Scotty Court MD ELECTRONICALLY SIGNED 04/24/2014 9:43

## 2014-12-08 NOTE — Consult Note (Signed)
Brief Consult Note: Diagnosis: Melena.   Patient was seen by consultant.   Comments: Ms. [name] Duffy is a very pleasant 81 y/o black female admitted with melena.  Hx diverticular bleeding, myasthenia gravis, HTN, hyperlipidemia, hypothyroidism, hx of asthma, diastolic CHF, & CKD Stage III with baseline hgb 8-9g.  She needs EGD to r/o upper GI bleed such as PUD with Dr Servando SnareWohl.  Discussed risks/benefits of procedure which include but are not limited to bleeding, infection, perforation & drug reaction.  Patient agrees with this plan & consent will be obtained.  Plan: 1) PPI BID 2) Hgb 3) EGD with Dr Servando SnareWohl 4) NPO for procedure Thanks for allowing us to participate in her care.  Please see full dictated note. #[id number].  Electronic Signatures: Joselyn ArrowJones, Caela Huot L (NP)  (Signed 16-Mar-15 09:48)  Authored: Brief Consult Note   Last Updated: 16-Mar-15 09:48 by Joselyn ArrowJones, Naleyah Ohlinger L (NP)

## 2014-12-08 NOTE — H&P (Signed)
PATIENT NAME:  [name] Duffy, [name] Duffy MR#:  [id number] DATE OF BIRTH:  June 30, 1932  DATE OF ADMISSION:  12/28/2013  REFERRING PHYSICIAN:  Dr. Darci Current   PRIMARY CARE PHYSICIAN: Dr. Darreld Mclean.   CHIEF COMPLAINT: Dysphagia, decreased oral secretions.   HISTORY OF PRESENT ILLNESS: This is an 81 year old female with known history of myasthenia gravis, atrial fibrillation on anticoagulation, CHF, hypertension, hypothyroidism, and GERD, patient presents with complaints of dysphagia. Increased oral secretion, difficulty swallowing, and generalized weakness.  Patient had increased oral secretions.  Was able to swallow, but does not appear to be in severe respiratory distress had NIF of  -30. Saturating well on oxygen, so received 10 of IV Decadron for myasthenia gravis crisis. Hospitalist requested the patient for further evaluation and treatment of her myasthenia gravis. The patient was recently admitted to the hospital due to history of lower GI bleed where her hemoglobin appears to be stable at this point, as well the patient reports some visual problems, some double vision, especially when it comes to seeing colors. The patient received 10 of IV Decadron in ED,  hospitalist requested to admit the patient.   PAST MEDICAL HISTORY: 1. Diastolic congestive heart failure.  2. Hypertension.  3. Hypothyroidism.  4. GERD.  5. Myasthenia gravis.  6. Atrial fibrillation not on anticoagulation but on amiodarone.  7. Bradycardia in the past due to beta blockers.   ALLERGIES: CRESTOR.   HOME MEDICATIONS:  1. Prednisone 7.5 mg daily.  2. Tylenol as needed.  3. pyridostigmine  30 mg 4 times a day.  4. Amiodarone 220 mg  oral daily.  5. Benzonatate 100 mg oral 3 times a day.  6. Norvasc 5 mg daily.  7. Lasix 40 mg daily.  8. Ferrous sulfate 325 mg oral daily.  9. Docusate sodium 1 capsule oral 2 times a day.  10. Amoxicillin 500 mg oral 3 times a day.  11. Omeprazole 20 mg daily.  12. Levothyroxine  50 mcg oral daily.  13. Hydralazine 100 mg oral 3 times a day.  14. Multivitamin 1 tablet daily.   SOCIAL HISTORY: No smoking. No alcohol. No illicit drug use. Lives at home with her sister.   FAMILY HISTORY: Both parents had  congestive heart failure, as well as history of CVA in the family.   REVIEW OF SYSTEMS:  GENERAL:  Reports fever, chills, fatigue, weakness.  EYES: Denies inflammation, glaucoma, reports blurry vision, especially when it comes to color.  EARS, NOSE, AND THROAT:  Denies tinnitus, ear pain, epistaxis, or discharge.  RESPIRATORY: Denies cough, wheezing, hemoptysis. Reports increased total secretions.  CARDIOVASCULAR: Denies chest pain, edema, palpitations, syncope.  GASTROINTESTINAL: Denies nausea, vomiting, diarrhea, abdominal pain, hematemesis. Reports dysphagia, difficulty swallowing.  ENITOURINARY: Denies dysuria, hematuria, and renal colic.  ENDOCRINE: Denies polyuria, polydipsia.  HEMATOLOGY: Denies anemia, easy bruising, bleeding, or diatheses.   INTEGUMENTARY:  Denies acne, rash, or skin lesion.  MUSCULOSKELETAL: Denies any swelling, gout, arthritis, cramps.  NEUROLOGIC: Denies history of CVA, TIA, vertigo, tremors. Reports history of myasthenia gravis. PSYCHIATRIC:  Denies anxiety, insomnia, or depression.   PHYSICAL EXAMINATION:  VITAL SIGNS: Temperature 98, pulse 54, respiratory rate 18, blood pressure 190/88, saturating 99% on oxygen.   GENERAL: Frail, elderly female looks comfortable with no apparent distress.  HEENT: Head atraumatic, normocephalic. Pupils equal, reactive to light. Pink conjunctivae. Anicteric sclerae. Moist oral mucosa with increased oral sick secretions.  NECK: Supple. No thyromegaly. No JVD.  CHEST: Fair air entry bilaterally. No wheezing, rales, or rhonchi. No  use of accessory muscle.  CARDIOVASCULAR: S1, S2 heard. Rubs, murmurs, or gallops.  ABDOMEN: Soft, nontender, nondistended. Bowel sounds present.  EXTREMITIES: No edema. No  clubbing. No cyanosis. Pedal pulses +2 bilaterally.  PSYCHIATRIC: Awake, alert x 3. Intact judgment and insight. NEUROLOGIC:   Motor strength 5/5 in all extremities. Sensation symmetrical and intact.  MUSCULOSKELETAL: No joint effusion or erythema. :   PERTINENT LABORATORY DATA: Glucose 81, BUN 28, creatinine 1.55, sodium 144, potassium 5.1, chloride 110, CO2 of 31. Anion gap 3. Troponin less than 0.02. White blood cells 9.6, hemoglobin 10.3, hematocrit 32.1, platelets 225, 000.   ASSESSMENT AND PLAN:  1. Dysphagia, this is most likely related to myasthenia gravis crisis. As the patient has increased secretion and having hard time swallowing, but she does not appear to be in imminent  respiratory failure. She has NIF of -30. At this point, we will hold on intubation, but we will start her on IV Ig and we will start her on IV steroids, large dose. We will keep her n.p.o. We will admit to CCU for further observation. We will check NIF daily. We will continue her on  pyridostigmine at this point.  2. Hypertension, uncontrolled. We will resume patient back on her home medication and will add p.r.n. hydralazine for her.  3. Hypothyroidism. We will continue with Synthroid. 4. Gastroesophageal reflux disease. Continue with Protonix, especially since she is on a large dose of steroids.  5. Deep vein thrombosis prophylaxis. The patient will be on sequential compression device and subcutaneous heparin.  6. Atrial fibrillation. The patient is currently normal sinus rhythm. We will continue with amiodarone.  CODE STATUS: The patient reports she is a full code.    TOTAL TIME SPENT ON ADMISSION AND PATIENT CARE: 55 minutes.     ____________________________ Starleen Armsawood S. Patrena Santalucia, MD dse:dd D: 12/28/2013 04:43:30 ET T: 12/28/2013 05:14:17 ET JOB#: [id number]  cc: Starleen Armsawood S. Garfield Coiner, MD, <Dictator> Kemoni Quesenberry Teena IraniS Ido Wollman MD ELECTRONICALLY SIGNED 12/29/2013 2:13

## 2014-12-08 NOTE — Consult Note (Signed)
Chief Complaint:  Subjective/Chief Complaint Pt denies melena, abdominal pain, nausea or vomiting.  Appetite improved.   Brief Assessment:  GEN well developed, well nourished, no acute distress, A/Ox3   Cardiac Regular   Respiratory normal resp effort   Gastrointestinal details normal Soft  Nontender  Nondistended  Bowel sounds normal   EXTR negative cyanosis/clubbing, negative edema   Additional Physical Exam Skin: war,m, dry, intact   Lab Results:  Routine Chem:  17-Mar-15 05:25   Glucose, Serum 74  BUN  26  Creatinine (comp)  1.63  Sodium, Serum 145  Potassium, Serum 4.5  Chloride, Serum  112  CO2, Serum 31  Calcium (Total), Serum 8.8  Anion Gap  2  Osmolality (calc) 292  eGFR (African American)  34  eGFR (Non-African American)  29 (eGFR values <86m/min/1.73 m2 may be an indication of chronic kidney disease (CKD). Calculated eGFR is useful in patients with stable renal function. The eGFR calculation will not be reliable in acutely ill patients when serum creatinine is changing rapidly. It is not useful in  patients on dialysis. The eGFR calculation may not be applicable to patients at the low and high extremes of body sizes, pregnant women, and vegetarians.)  Magnesium, Serum 2.2 (1.8-2.4 THERAPEUTIC RANGE: 4-7 mg/dL TOXIC: > 10 mg/dL  -----------------------)  Routine Hem:  17-Mar-15 05:25   WBC (CBC) 7.6  RBC (CBC)  2.69  Hemoglobin (CBC)  8.3  Hematocrit (CBC)  24.9  Platelet Count (CBC) 182  MCV 93  MCH 30.9  MCHC 33.3  RDW  14.8  Neutrophil % 56.2  Lymphocyte % 32.5  Monocyte % 9.6  Eosinophil % 0.7  Basophil % 1.0  Neutrophil # 4.3  Lymphocyte # 2.5  Monocyte # 0.7  Eosinophil # 0.1  Basophil # 0.1 (Result(s) reported on 31 Oct 2013 at 06:12AM.)   Assessment/Plan:  Assessment/Plan:  Assessment Melena:  Secondary to esophageal diverticulum & gastric angiodysplastic lesions s/p thermal treatment Anemia: Hgb stable   Plan 1) PPI BID 2)  monitor for further bleeding 3) FU with Dr SGustavo Lahoutpatient Please call if you have any questions or concerns   Electronic Signatures: JAndria Meuse(NP)  (Signed 17-Mar-15 11:00)  Authored: Chief Complaint, Brief Assessment, Lab Results, Assessment/Plan   Last Updated: 17-Mar-15 11:00 by JAndria Meuse(NP)

## 2014-12-08 NOTE — H&P (Signed)
PATIENT NAME:  [name] Duffy, [name] Duffy MR#:  [id number] DATE OF BIRTH:  08/19/1931  DATE OF ADMISSION:  02/21/2014  PRIMARY CARE PHYSICIAN: Dr. Darreld McleanLinda Miles.   The patient is a 81 year old African American female with past medical history significant for history of medical problems including admission in 01/2014 for acute on chronic renal failure, hypertension, anemia. She received 1 unit of packed red blood cell transfusion, presented back to the hospital with complaints of feeling febrile, hypotensive, as well as weak. Apparently, the patient was doing relatively well up until 2 or 3 days ago when she started having significant pain all over her body, especially in left side of her body, left knee, as well as left wrist and hand  MCP joints, as well as the wrist itself was noted to be swollen. She was weak and she presented to the Emergency Room for further evaluation. In the Emergency Room, she was noted to have patchy right lower lobe pneumonia. Chest x-ray and hospitalist services were contacted for admission.   PAST MEDICAL HISTORY: Significant for history of acute on chronic renal failure due to ATN on admission in June 2015, hypertension, history of anemia status post 1 unit of packed red blood cell transfusion during the same admission. History of hypertension, hypothyroidism, myasthenia gravis, history of atrial fibrillation in sinus rhythm now, generalized weakness, dehydration  , diastolic chronic congestive heart failure. Gastroesophageal reflux disease, as well as bradycardia secondary to beta blockers.   ALLERGIES: CRESTOR.     MEDICATIONS: According to medical records, the patient is on:  1.  Amiodarone 200 mg p.o. daily,  2. Amlodipine 5 mg  p.o. daily.  3. Docusate sodium 100 mg p.o. twice daily.  4. Donepezil 10 mg p.o. daily at bedtime.  5. Iron sulfate 325 mg twice daily.  6. Furosemide  40 mg p.o. daily.  7. Hydralazine 100 mg twice daily.  8. Levothyroxine 30  mcg p.o. daily.  9.  Omeprazole 10 mg p.o. twice daily.  10. Potassium chloride 10 mEq once daily.  11. Pyridostigmine 30 mg 4 times daily.   SOCIAL HISTORY: Former smoker. No history of alcohol or drug abuse. Lives with her sister.   FAMILY HISTORY: Both parents had congestive heart failure, history of stroke in the family.   REVIEW OF SYSTEMS:  CONSTITUTIONAL:  The patient admits of having some weakness. Admits of some blurring of vision, pains in the left side of her body, especially left knee as well as left wrist, left  hand. Admits of weight loss, poor appetite. Admits of cataract removal on the left side only. Denies any fevers, chills, fatigue, weakness, double vision or glaucoma.  EARS, NOSE, THROAT: Denies any tinnitus, allergies , difficulty swallowing. Admits of oxygen use all the time chronically. RESPIRATORY: Denies  cough, wheezes, asthma, chronic obstructive pulmonary disease.  CARDIOVASCULAR: Denies chest pains, orthopnea, edema, arrhythmias, palpitations or syncope.  GASTROINTESTINAL: Denies nausea, vomiting, diarhea ,  constipation, abdominal pain.  GENITOURINARY: Denies dysuria, hematuria, frequency, incontinence.  ENDOCRINOLOGY: No thyroid problems, heat or cold intolerance or excessive thirst. Hematology:  no anemia, easy bruising,bleeding, swollen glands . SKIN:  Denies acne, rash,  lesions or change in moles.  MUSCULOSKELETAL: Denies arthritis, cramps, swelling, gout.  NEUROLOGIC: No new numbness, epilepsy or tremor.  PSYCHIATRIC: Denies anxiety, insomnia, depression.   PHYSICAL EXAMINATION: VITAL SIGNS: On arrival to the hospital, temperature was 98.5, pulse was 65, respiration was 20, blood pressure 161/69, saturation was 95% on oxygen therapy.  GENERAL: This is a well-developed, well-nourished,  thin, African American female in no significant distress lying on the stretcher.  HEENT: Her pupils are equal reactive to light. Extraocular movements intact. No incerus or conjunctivitis. She  did have some tearing from her eyes.  NECK: No masses. Supple, nontender. Thyroid is not enlarged. Full range of motion.  LUNGS: Clear to auscultation. A few crackles were heard on the right, as well as rhonchi with  significant decrease in breath sounds but no wheezing. No labored inspirations, increased effort, dullness to percussion or overt respiratory distress.  CARDIOVASCULAR: S1, S2 appreciated. Rhythm is regular. PMI not lateralized. The patient does have a 3/6 systolic murmur in all precordium. Chest is nontender to palpation, normal peripheral pulse. No lower extremity edema, calf tenderness or cyanosis was noted.  ABDOMEN: Soft, nontender. Bowel sounds are present. No hepatosplenomegaly or masses were noted.  RECTAL: Deferred.  MUSCLE STRENGTH: Able to move all extremities; however, splinting her left  wrist as well as left knee. Both knees are actually somewhat tender but no swelling was noted. There is significantly restricted more restricted full range of motion in the left knee than the right knee. She has swelling of the left hand. The wrist, as well as hand is swollen and tender to touch and range of motion is decreased.  SKIN: Otherwise did not reveal any rashes, lesions, erythema, nodularity or induration, it was warm and dry to palpation. Geographic tongue was noted. No adenopathy in the cervical region.  NEUROLOGICAL: Cranial nerves grossly intact. Sensory is intact. No dysarthria or aphasia.  PSYCHIATRIC: The patient is alert, oriented in person and place, cooperative. Memory is good. No significant confusion, agitation or depression noted.   EKG is not done. The patient's lab data done on arrival to the hospital. BMP showed glucose of 63, BUN and creatinine were 31 and 2.30 as compared to 22 and 1.95 on the  29th of June 2015. Otherwise BMP was unremarkable. The patient's albumin level was 2.5, total protein of 6.3. Cardiac enzymes, first set negative. CBC: White blood cell count  was 11.5, hemoglobin was 8.5, platelet count 251.   RADIOLOGIC STUDIES: Chest x-ray PA and lateral 02/21/2014, showed cardiomegaly, chronic obstructive pulmonary disease, as well as patchy right lower lobe opacity concerning for pneumonia.   ASSESSMENT AND PLAN: 1. Pneumonia. Admit the patient to medical floor. Started her on Levaquin and Zosyn. Get speech therapist to evaluate the patient and get possible modified barium swallowing study done if needed. We will get sputum cultures and adjust antibiotics according to culture results.  2. Gout exacerbation. We will give steroids IV once in the Emergency Room and then prednisone  adenopathy in the hospital. Follow the patient clinically.  3. Mild dehydration. We will start the patient on IV fluids.  4. Hyperglycemia of unclear etiology. At this time, possibly due to poor p.o. intake. Get hemoglobin A1c and get cortisol level  5. Weakness due to above. Get physical therapist involved.  6. Anemia. Follow up with rehydration.  7. Leukocytosis. Follow with antibiotic therapy.   TIME SPENT: 50 minutes on this patient.    ____________________________ Katharina Caper, MD rv:sg D: 02/21/2014 13:23:51 ET T: 02/21/2014 14:06:20 ET JOB#: [id number]  cc: Katharina Caper, MD, <Dictator> Leanna Sato, MD  Cami Delawder MD ELECTRONICALLY SIGNED 03/25/2014 14:46

## 2014-12-08 NOTE — Consult Note (Signed)
   Comments   I met with [name] Duffy and her sister. [name] Duffy has no children and was never married. Her sister is her only living sibling and her Media planner. [name] Duffy has been home about a week following a stay at [facility]. Initially, she was doing well but then stopped eating/drinking. Her sister thinks [name] Duffy was self-limiting her oral intake to reduce her incontinent episodes. [name] Duffy tells me that she feels she is doing better and is willing to eat and undergo medical treatment (ie. labs). However, [name] Duffy also says that she knows if she does not eat, she will likely die, and thinks this will likely be the outcome. [name] Duffy denies overt depression but does endorse some sadness and is willing to try an anti-depressant. Will start remeron.  says she wants to go home when she leaves the hospital and sister is willing to allow this. We talked about hospice involvement and both would agree. Will order hospice screening. [name] Duffy will also have a CAPS worker in the home.  hospice screeningstart mirtazapine 7.79m po qhsensure tid/wm  dx: myasthenia gravis; dementia, cachexia (BMI 16.7)  Electronic Signatures: Borders, JKirt Boys(NP)  (Signed 10-Aug-15 11:53)  Authored: Palliative Care Phifer, NIzora Gala(MD)  (Signed 10-Aug-15 16:51)  Authored: Palliative Care   Last Updated: 10-Aug-15 16:51 by Phifer, NIzora Gala(MD)

## 2014-12-08 NOTE — H&P (Signed)
PATIENT NAME:  [name] Duffy, [name] Duffy MR#:  [id number] DATE OF BIRTH:  1932-03-24  DATE OF ADMISSION:  10/30/2013  PRIMARY CARE PHYSICIAN: Delight Stare, MD  REFERRING PHYSICIAN: Lurline Hare, MD  CHIEF COMPLAINT: Bright red blood per rectum.   HISTORY OF PRESENT ILLNESS: This is an 81 year old female with known history of myasthenia gravis, atrial fibrillation and not on any anticoagulation, diastolic CHF, hypertension, hypothyroidism, and GERD who presents with complaints of bright red blood per rectum. The patient reports her symptoms have been going on for the last 24 hours. Reports she had 2 episodes of reports she had 2 episodes of bloody stools. The patient had rectal exam done by ED physician. There was no melena noticed, but bright blood per rectum. The patient had history of lower GI bleed due to diverticular cause last year. The patient's last EGD done was done in 2013. As well had flexible sigmoidoscopy done by Dr. Gustavo Lah in 2013 with only showing polyps status post resection in her sigmoid colon and without any significant finding in her EGD. The patient's hemoglobin was noticed to be dropped from 9.9 to 8.8. The patient was not hypotensive. Denies any dizziness or lightheadedness. She complains of shortness of breath, which is chronic, where she is at 2 liters baseline nasal cannula. She complains of mild abdominal pain, described it as grueling.   PAST MEDICAL HISTORY: 1.  Diastolic CHF. 2.  Hypertension.  3.  Hypothyroidism.  4.  GERD.  5.  Myasthenia gravis.  6.  Atrial fibrillation, not on anticoagulation, on amiodarone. 7.  Bradycardia in the past due to beta blockers.   ALLERGIES: CRESTOR.  HOME MEDICATIONS: 1.  Prednisone 7.5 mg oral daily.  2.  Tylenol 650 every 6 hours as needed.  3.  Amiodarone 200 mg oral daily.  4.  Pyridostigmine 30 mg oral 4 times a day.  5.  Lasix 40 mg oral daily.  6.  Ferrous sulfate 325 mg oral daily with meal.  7.  Docusate sodium 100 mg oral 2  times a day as needed.  8.  Omeprazole 20 mg oral 2 times a day.  9.  Levothyroxine 50 mcg oral daily.  10.  Hydralazine 100 mg oral 3 times a day.  11.  Daily vitamin D 1 tablet oral daily.   SOCIAL HISTORY: No smoking. No alcohol abuse. No illicit drug abuse. Lives at home with her sister.   FAMILY HISTORY: Both mother and father are deceased. Mother died from congestive heart failure. Father died from CVA. Mother as well had breast cancer.   REVIEW OF SYSTEMS:  CONSTITUTIONAL: The patient denies fever, chills, fatigue, weakness, weight gain, weight loss.  EYES: Denies blurry vision, double vision, inflammation, glaucoma.  ENT: Denies tinnitus, ear pain, hearing loss, epistaxis, or discharge.  RESPIRATORY: Denies cough, wheezing, hemoptysis, dyspnea.  CARDIOVASCULAR: Denies chest pain, arrhythmia, palpitations, syncope. Has chronic edema.  GASTROINTESTINAL: Denies nausea, vomiting, diarrhea. Has complaints of abdominal pain, but she described it as grueling. Denies hematemesis, melena. Reports of bright red blood per rectum.  GENITOURINARY: Denies dysuria, hematuria, or renal colic.  ENDOCRINE: Denies polyuria, polydipsia, heat or cold intolerance.  HEMATOLOGY: Denies easy bruising. Reports history of anemia.  INTEGUMENT: Denies acne, rash or skin lesion.  MUSCULOSKELETAL: Denies any swelling, gout, arthritis or cramps.  NEUROLOGIC: History of myasthenia gravis. Denies any CVA, TIA, headache, ataxia, tremor.  PSYCHIATRIC: Denies anxiety, insomnia or depression.   PHYSICAL EXAMINATION: VITAL SIGNS: Temperature 98.9, pulse 58, respiratory rate 16, blood pressure 176/81, saturating  100% on oxygen.  GENERAL: Well-nourished female who looks comfortable and in no apparent distress.  HEENT: Head atraumatic, normocephalic. Pupils equal and reactive to light. Pink conjunctivae. Anicteric sclerae. Moist oral mucosa.  NECK: Supple. No thyromegaly. No JVD.  CHEST: Good air entry bilaterally. No  wheezing, rales, rhonchi.  CARDIOVASCULAR: S1 and S2 heard. No rubs, murmurs, or gallops.  ABDOMEN: Soft, nontender, nondistended. Bowel sounds present.  EXTREMITIES: +1 edema bilaterally. No clubbing. No cyanosis. Pedal pulses and radial pulses +2 bilaterally.  PSYCHIATRIC: Appropriate affect. Awake and alert x3. Intact judgment and insight.  NEUROLOGIC: Cranial nerves grossly intact. Motor 5 out of 5. No focal deficits.  MUSCULOSKELETAL: No joint effusion or erythema.   PERTINENT LABORATORIES: Glucose 86, BUN 32, creatinine 1.72, sodium 141, potassium 4.3, chloride 109, CO2 30, ALT 14, AST 18, alk phos 49. Troponin less than 0.02. White blood cells 7.9, hemoglobin 8.8, hematocrit 26.6, platelets 201,000. Urinalysis negative for leukocyte esterase and nitrites.   ASSESSMENT AND PLAN: 1.  Lower gastrointestinal bleed. Presents with bright red blood per rectum. This is most likely diverticular bleed as the patient has history. I could not see any recent colonoscopy done on her in our records. Last thing done was flexible sigmoidoscopy in 2013, which only showed polyps. There was no mention of diverticulosis though. Will admit the patient. Will monitor her hemoglobin every 8 hours. Will transfuse if needed. The patient will have type and screen in the ED. We will consult gastroenterology service. Will keep her on clear liquid diet. We will have her on IV Protonix 40 b.i.d.  2.  Chronic kidney disease. Appears to be stable, at baseline. We will monitor, holding Lasix, having her on gentle hydration due to her known history of diastolic congestive heart failure. 3.  Atrial fibrillation. The patient is currently in normal sinus rhythm, at 59 beats per minute. Continue with amiodarone and certainly she is not a candidate for anticoagulation currently.  4.  Myasthenia gravis. The patient denies any focal deficits, any visual problems. We will continue her on prednisone and pyridostigmine.   5.   Hypothyroidism. Continue with Synthroid.  6.  Gastroesophageal reflux disease. She is on Protonix.  7.  Hypertension. Blood pressure is mildly elevated. Due to her gastrointestinal bleed, will decrease her p.o. prednisone level. Will try to manage with IV p.r.n. hydralazine due to its short-acting effect.  8.  Chronic respiratory failure. Appears to be at baseline, on 2 liters per minute.  9.  Deep vein thrombosis prophylaxis. Sequential compression device and TED hose. No chemical anticoagulation due to gastrointestinal bleed.   CODE STATUS: The patient reports she is a FULL code.  TIME SPENT ON ADMISSION AND PATIENT CARE: 55 minutes.   ____________________________ Albertine Patricia, MD dse:sb D: 10/30/2013 07:54:38 ET T: 10/30/2013 13:38:27 ET JOB#: [id number]  cc: Albertine Patricia, MD, <Dictator> Loriann Bosserman Graciela Husbands MD ELECTRONICALLY SIGNED 11/06/2013 1:37

## 2014-12-08 NOTE — Discharge Summary (Signed)
PATIENT NAME:  [name] Duffy, [name] MR#:  [id number] DATE OF BIRTH:  [birth year]  DATE OF ADMISSION:  02/21/2014 DATE OF DISCHARGE:  02/26/2014  DATE OF DISCHARGE: Likely on July 13th pending bed availability at rehab.  DISCHARGE DIAGNOSES:  1. Pneumonia.  2. Anemia of chronic disease. 3. Acute on chronic renal failure, chronic kidney disease stage III 4. Myasthenia gravis.  5. Generalized weakness.  6. Hypertension.  7. Chronic atrial fibrillation.  8. Dementia.   DISCHARGE MEDICATIONS:  1. Amiodarone 100 p.o. daily. Please note that amiodarone dose is decreased from 200 mg. 2. Colace 100 mg p.o. b.i.d. 3. Furoside 40 mg daily. 4. Levothyroxine 50 mcg p.o. daily.  5. Pyridostigmine 60 mg p.o. half tablet 4 times daily. 6. Amlodipine 5 mg p.o. daily. 7. Ferrous sulfate 325 mg p.o. b.i.d. 8. Omeprazole 20 mg p.o. b.i.d. 9. Hydralazine 100 mg p.o. b.i.d.  11. Aricept 10 mg p.o. daily. 12. Ensure 1 can p.o. t.i.d.  13. Augmentin 1 tablet p.o. b.i.d.  14. The patient will get doxycycline 100 mg p.o. b.i.d. for 5 days.  CONSULTATIONS: Physical therapy consult. Physical therapy recommended short-term rehab. The patient also was seen by a neurologist.   HOSPITAL COURSE: An 81 year old female patient admitted because of generalized weakness and fever.  1. The patient found to have right lower lobe pneumonia on x-ray on admission so she was admitted to hospitalist service and started on IV fluids and IV antibiotics and she received IV Levaquin and Zosyn. The patient's lab data on admission showed troponin levels less than 0.02 and white count was 11.7 on admission. She improved nicely and blood culture did not show any growth. The patient's white count has normalized, so we are discharging her and physical therapy recommended rehab and she will go to rehab with antibiotics for pneumonia and she is not hypoxic and she is afebrile.  2. Acute renal failure on chronic kidney disease stage III. The  patient's renal function showed a BUN of 31, creatinine 2.30, and she received IV fluids and the patient's kidney function improved and most recent one on 11th of July was creatinine of 2, BUN 35. [facility].  3. Anemia, acute on chronic anemia. The patient's hemoglobin is stable at 7.6 and 8.2. The patient was here 2 weeks ago and received blood transfusion. The patient is supposed to see GI as an outpatient and patient's family advised that she needs to have a GI workup as an outpatient and the patient may have anemia of chronic disease. The patient denies any occult loss of blood in stool but patient had a colonoscopy in July of 2013 and it showed some diverticulosis of the entire colon and also some polyps which are very small but several polyps in upper rectal vault and patient had a flexible sigmoidoscopy also in 2 months that is in December of 2013 and it showed polyps which were 1-2 millimeters in size which were removed and cold biopsy was done and patient also had an EGD done in October of 2013 and it showed normal < stomach, and hiatal hernia. The patient did not have any ulcers. So I advised the family to have a primary doctor refer her to gastroenterologist for further workup. 4. Myasthenia gravis. Patient seen by Dr. Loretha BrasilZeylikman to see if she had any myasthenia gravis crisis because of her weakness but he suggested that it does not look like a crisis and he did not order further workup. The patient was continued on pyridostigmine  and her symptoms improved and the patient felt better.  5. Chronic atrial fibrillation. The patient is not on anticoagulation. She is on amiodarone but her heart rate is like around 50s so I decreased the amiodarone from 200 to 100 mg daily.  6. Hypothyroidism. Continue levothyroxine at 0.05 mg in the morning.   TIME SPENT ON DISCHARGE SUMMARY: More than 30 minutes.   PRIMARY DOCTOR: Dr. Darreld Mclean.    ____________________________ Katha Hamming, MD sk:lt D: 02/26/2014 12:43:16 ET T: 02/26/2014 23:12:00 ET JOB#: [id number]  cc: Katha Hamming, MD, <Dictator> Leanna Sato, MD Katha Hamming MD ELECTRONICALLY SIGNED 03/12/2014 9:05

## 2014-12-08 NOTE — Consult Note (Signed)
PATIENT NAME:  [name] Duffy, Terre MR#:  [id number] DATE OF BIRTH:  08-20-1931  DATE OF CONSULTATION:  10/30/2013  REFERRING PHYSICIAN:  Dr. Randol KernElgergawy CONSULTING PHYSICIAN:  Dr. Midge Miniumarren Wohl and Joselyn ArrowKandice L. Fahima Cifelli, NP  PRIMARY GASTROENTEROLOGIST: Dr. Barnetta ChapelMartin Skulskie.  PRIMARY CARE PHYSICIAN: [facility] at [facility].   REASON FOR CONSULTATION: GI bleed.   HISTORY OF PRESENT ILLNESS: [name] Duffy reports she was in her usual state of health until yesterday when she developed a large amount of dark black, tarry stool after straining with a hard bowel movement. She says she has had chronic constipation and went 2 days without a bowel movement. She did not take anything for this. She denies any NSAIDs. She is on chronic steroids with history of myasthenia gravis. She denies any nausea, vomiting, heartburn or indigestion. She does occasionally cough when she drinks liquids. Denies any dysphagia or odynophagia. Her appetite has been poor over the last few months and she thinks she has lost a few pounds, but cannot quantify. She denies any abdominal pain, but does state her stomach has been "growling." She was admitted with a hemoglobin of 8.8. Last hemoglobin in [facility] was 9.9 on October 04, 2013. She ranges anywhere between 79 and 9 over the last several years. Her previous GI workup includes diverticular bleed with flexible sigmoidoscopy by Dr. Marva PandaSkulskie on August 01, 2012 where she had 3 flat polyps removed from the rectosigmoid. Flexible sigmoidoscopy on May 17, 2012, she had bloody effluent. The scope was advanced to 35 cm. He was unable to move further proximally due to obscuration of the lumen by bloody effluent. Diverticula were noted at that time. Colonoscopy by Dr. Marva PandaSkulskie February 29, 2012, showed diverticulosis in the entire examined colon, clot noted and diverticulum without active bleeding. EGD by Dr. Marva PandaSkulskie on May 18, 2012 showed esophageal motility disorder, hiatal  hernia and was otherwise normal. Biopsy results from polypectomies showed mucosal prolapse type polyp, lymphangioma and negative for dysplasia and malignancy.   PAST MEDICAL AND SURGICAL HISTORY: Myasthenia gravis, pancolonic diverticulosis with diverticular bleed, diastolic congestive heart failure, hypertension, hypothyroidism, GERD,  esophageal motility disorder, stage III chronic kidney disease and asthma.   MEDICATIONS PRIOR TO ADMISSION: Amiodarone 200 mg daily, multivitamin daily, docusate sodium 100 mg b.i.d., ferrous sulfate 325 mg daily, furosemide 40 mg daily, hydralazine 100 mg t.i.d., levothyroxine 50 mcg daily, Mapap 325 mg 2 tablets q.6 hours p.r.n., omeprazole  20 mg b.i.d., prednisone 2.5 mg 3 tablets at 12:00 p.m. and pyridostigmine 60 mg 0.5 tablets q.i.d.   ALLERGIES: CRESTOR CAUSES UNKNOWN REACTION.   SOCIAL HISTORY: The patient has a remote history of tobacco use. Denies any alcohol or illicit drug use. She lives alone. She does not have any children. She is retired from [employer].   FAMILY HISTORY: There is no known family history of colon carcinoma, liver or chronic GI problems.   REVIEW OF SYSTEMS: See HPI, otherwise negative 12-point review of systems.   PHYSICAL EXAMINATION:  VITAL SIGNS: Temperature 98.3, pulse 63, respirations 16, blood pressure 175/74, oxygen saturation 99% on 2 L via nasal cannula.  GENERAL: She is an elderly black female who is alert and oriented x 3. No acute distress.  HEENT: Sclerae clear, anicteric. Conjunctivae pink. Oropharynx pink and moist without lesions.  NECK: Supple without mass or thyromegaly.  CHEST: Heart regular rhythm. Normal S1, S2. No murmurs, clicks, rubs, or gallops.  LUNGS: With decreased breath sounds bilaterally. No acute distress.  ABDOMEN: Positive  bowel sounds x 4. No bruits auscultated. Abdomen is soft, nontender, nondistended, without palpable mass or hepatosplenomegaly. No rebound or guarding.  RECTAL: Rectum is mildly  dilated. She has a small amount of dark black stool that was obtained from the vault, which is Hemoccult positive. No internal or external lesions palpated, nontender.  EXTREMITIES: Without edema or cyanosis.  SKIN: Brown, warm and dry without rash or jaundice.  NEUROLOGIC: Grossly intact. Strength is good. Equal strength and movement bilaterally.  PSYCHIATRIC: Alert, cooperative, normal mood and affect.   LABORATORY STUDIES: Creatinine 1.72, BUN 32, chloride 109, anion gap 2, otherwise normal BMP. LFTs normal except for albumin of 3.2. Troponin negative. Hematocrit 26.6. White blood cell count 7.9, platelets 201. Urinalysis benign.   IMPRESSION: [name] Duffy is a very pleasant 81 year old black female admitted with melena. She has history of diverticular bleeding, myasthenia gravis, hypertension, hyperlipidemia, hypothyroidism, history of asthma, diastolic congestive heart failure and chronic kidney disease stage III with baseline hemoglobin 8 to 9 grams. She needs upper esophagogastroduodenoscopy to rule out upper gastrointestinal bleed such as peptic ulcer disease with Dr. Servando Snare. I discussed risks and benefits to include, but not limited to bleeding, infection, perforation, drug reaction. She agrees with the plan and consent was obtained.   PLAN:  1.  PPI b.i.d.  2.  We will check hemoglobin today and plan for esophagogastroduodenoscopy either later today or tomorrow pending results.  3.  N.p.o. for procedure.  Thank you for allowing [place] to participate in her care. ____________________________ Joselyn Arrow, NP klj:aw D: 10/30/2013 09:47:53 ET T: 10/30/2013 10:53:17 ET JOB#: [id number]  cc: Joselyn Arrow, NP, <Dictator> DR. MILES AT [facility] Joselyn Arrow FNP ELECTRONICALLY SIGNED 12/05/2013 14:09

## 2014-12-09 NOTE — Consult Note (Signed)
PATIENT NAME:  [name] Duffy, Omni MR#:  [id number] DATE OF BIRTH:  05-13-1932  DATE OF CONSULTATION:  02/27/2012  REFERRING PHYSICIAN:   CONSULTING PHYSICIAN:  Laurier NancyShaukat A. Billie Intriago, MD  INDICATION FOR CONSULTATION: Congestive heart failure.   HISTORY OF PRESENT ILLNESS: This is a 81 year old African American female with a past medical history of hypertension, hyperlipidemia, myasthenia gravis who presented to the Emergency Room with dark bloody stools and GI wants to do colonoscopy but because of possibility of congestive heart failure I was asked to evaluate the patient. Patient denies any chest pain but does have shortness of breath on minimal exertion, also has occasional PND and orthopnea.   MEDICATIONS:  1. Carvedilol 6.25 b.i.d.  2. Lasix 60 mg p.o. daily.  3. Hydralazine 25 t.i.d.  4. Lisinopril 20 mg p.o. daily.   ALLERGIES: She is allergic to Crestor.   PAST MEDICAL HISTORY: History of arthritis and pneumonia in the past.   SOCIAL HISTORY: Unremarkable.   FAMILY HISTORY: Sister had myocardial infarction.   PHYSICAL EXAMINATION:  GENERAL: She is alert, oriented x3, in no acute distress.   VITALS: Stable.   NECK: Positive jugular venous distention.   LUNGS: Clear.   HEART: Regular rate and rhythm. Normal S1, S2. No audible murmur.   ABDOMEN: Soft, nontender, positive bowel sounds.   EXTREMITIES: No pedal edema.   LABORATORY, DIAGNOSTIC AND RADIOLOGICAL DATA: BUN and creatinine are 26/1.56. Hemoglobin 8.8, hematocrit 26.9. EKG shows normal sinus rhythm, 61 beats per minute, nonspecific ST-T changes.   ASSESSMENT AND PLAN: Patient does have compensated congestive heart failure, is on 2 liters nasal cannula oxygen, is on good medications for congestive heart failure. Only thing is will add Lasix 40 daily and proceed with doing colonoscopy.   Thank you very much for referral.  ____________________________ Laurier NancyShaukat A. Jozef Eisenbeis, MD sak:cms D: 02/27/2012 09:30:45 ET T: 02/27/2012  09:38:54 ET JOB#: [id number]  cc: Laurier NancyShaukat A. Jacari Iannello, MD, <Dictator>  Laurier NancySHAUKAT A Nocole Zammit MD ELECTRONICALLY SIGNED 03/03/2012 13:13

## 2014-12-09 NOTE — Consult Note (Signed)
Chief Complaint:   Subjective/Chief Complaint Patient seen and examined, chart reviewed.  81 yo f admitted with hematochezia.  hemodynamically stable, no abdominal pain.  DRE shows small amount of dark red effluent mixed with soft brown stool.  DDX anal outlet versus diverticular bleeding.  less likely avms, or ischemia.  Continue serial hgbn bid and transfuse as needed.  If there is a significant drop of hgb, do GI bleeding study.  Recommend o/p colonoscopy in about 3 weeks unless there is a clinical change.  Following.   VITAL SIGNS/ANCILLARY NOTES: **Vital Signs.:   08-Jul-13 14:59   Vital Signs Type Routine   Temperature Temperature (F) 98.3   Celsius 36.8   Temperature Source Oral   Pulse Pulse 55   Respirations Respirations 18   Systolic BP Systolic BP 156   Diastolic BP (mmHg) Diastolic BP (mmHg) 73   Mean BP 100   Pulse Ox % Pulse Ox % 98   Brief Assessment:   Cardiac Regular    Respiratory clear BS    Gastrointestinal details normal Soft  Nontender  Nondistended  No masses palpable    Additional Physical Exam dre as above.   Lab Results: Routine Hem:  07-Jul-13 17:07    Hemoglobin (CBC)  10.8   Platelet Count (CBC) 242 (Result(s) reported on 21 Feb 2012 at 05:52PM.)  08-Jul-13 01:26    Hemoglobin (CBC)  10.5   Platelet Count (CBC) 213    09:40    Hemoglobin (CBC)  9.7 (Result(s) reported on 22 Feb 2012 at 10:20AM.)   Electronic Signatures: Barnetta ChapelSkulskie, Khristy Kalan (MD)  (Signed 08-Jul-13 15:56)  Authored: Chief Complaint, VITAL SIGNS/ANCILLARY NOTES, Brief Assessment, Lab Results   Last Updated: 08-Jul-13 15:56 by Barnetta ChapelSkulskie, Litsy Epting (MD)

## 2014-12-09 NOTE — H&P (Signed)
PATIENT NAME:  [name] Duffy, [name] Duffy MR#:  [id number] DATE OF BIRTH:  [birth year]  DATE OF ADMISSION:  02/21/2012  REFERRING PHYSICIAN: Dr. Mindi Junker PRIMARY CARE PHYSICIAN: Dr. Darreld Mclean  PRIMARY NEUROLOGIST: In [facility]  PRESENTING COMPLAINT: Bloody dark stools.   HISTORY OF PRESENT ILLNESS: [name] Duffy is a pleasant 81 year old woman with history of hypertension, hyperlipidemia, myasthenia gravis that was diagnosed about a year ago who presents with reports of two bowel movements with dark bloody stool, especially when she wipes. No prior episodes in the past. Reports that she has been on prednisone for at least a year now. No reports of aspirin or Goody powder use. She denies any nausea, vomiting, or hematemesis. No diarrhea. No abdominal pain. Denies any chest pain. Reports shortness of breath that is chronic and unchanged.   PAST MEDICAL HISTORY:  1. Hypertension.  2. Hyperlipidemia.  3. Myasthenia gravis diagnosed approximately one year ago.  4. Last admitted here for right lower lobe pneumonia in July 2009.  5. Arthritis.   PAST SURGICAL HISTORY: Left cataract surgery.   ALLERGIES: Crestor, unknown reaction.   MEDICATIONS:  1. Carvedilol 6.25 mg b.i.d.  2. Furosemide 60 mg daily.  3. Hydralazine 25 mg t.i.d.  4. Lisinopril 20 mg daily.  5. Acetaminophen 650 mg every six hours as needed.  6. Prednisone 10 mg daily.  7. Pyridostigmine 30 mg t.i.d.  8. Tramadol 50 mg every six hours as needed.  9. Xopenex inhaler 2 puffs every four hours as needed.   SOCIAL HISTORY: She lives in [place] with her niece. Quit tobacco 30 years ago. No alcohol use. She is retired.   FAMILY HISTORY: Mother had breast cancer and sister had myocardial infarction.   REVIEW OF SYSTEMS: CONSTITUTIONAL: No fevers, nausea, vomiting. EYES: History of cataracts. ENT: No epistaxis, discharge, or dysphagia. RESPIRATORY: No hemoptysis. CARDIOVASCULAR: No chest pain. She has chronic lower extremity edema.  No syncope. GASTROINTESTINAL: As per history of present illness. GENITOURINARY: No dysuria, hematuria. ENDO: No polyuria or polydipsia. HEME: No bruising. SKIN: No ulcers. MUSCULOSKELETAL: Reports pain related to arthritis. NEUROLOGIC: No history of stroke or seizure. PSYCH: Denies any suicidal ideation.   PHYSICAL EXAMINATION:  VITAL SIGNS: Temperature 97, pulse 64 with lowest pulse in the mid 50s, respiratory rate 22, blood pressure 161/76 with repeat pressure 181/82, sating at 92% on room air.   GENERAL: Lying in bed in no apparent distress.   HEENT: Normocephalic, atraumatic. Pupils equal, symmetric, nonicteric. Nares without discharge. Moist mucous membrane.   NECK: Soft and supple. No adenopathy or JVP.   CARDIOVASCULAR: Non-tachy. Slightly brady. No murmurs, rubs, or gallops.   LUNGS: Clear to auscultation bilaterally. No use of accessory muscles or increased respiratory effort.   ABDOMEN: Soft. Positive bowel sounds. No mass appreciated.   EXTREMITIES: Trace edema bilaterally. Dorsal pedis pulses intact.   MUSCULOSKELETAL: No joint effusion.   SKIN: No ulcers.   NEUROLOGIC: No dysarthria or aphasia. Symmetrical strength. No focal deficits.   PSYCH: She is alert and oriented. Patient is cooperative.   LABORATORY, DIAGNOSTIC AND RADIOLOGICAL DATA: EKG with normal sinus rhythm of 61. No ST elevation or depression. She has T wave inversion in aVR and V1 that is nonspecific. Urinalysis with specific gravity of 1.013, blood negative, pH 5, less than 1 per high-power field RBC, 1 per high-power field WBC. WBC 10, hemoglobin 10.8, hematocrit 33.5, platelet 242, MCV 85, glucose 132, BUN 35, creatinine 1.48, sodium 143, potassium 3.7, chloride 109, carbon dioxide 29, calcium 9.1. LFTs within  normal limits. INR 0.9.   ASSESSMENT AND PLAN: [name] Duffy is a 81 year old woman with history of hypertension, hyperlipidemia, myasthenia gravis presenting with reports of dark bloody stools.   1. Presumed lower GI bleed. She is guaiac-positive in the ED. Her hemoglobin from 2010 was normal at 13, presenting hemoglobin has dropped to 10.8. Will start on Protonix drip. Cycle hemoglobin. Possibly gastritis versus ulcer from chronic steroid use. Will hold prednisone and start on Solu-Medrol for now, may need to taper off until follow up with neurologist based on her EGD. Obtain GI consultation, clear diet and IV fluids.  2. Hypertension, uncontrolled. Resume hydralazine, carvedilol. Will monitor her heart rate on off unit telemetry. She is now in the 8950s. Hold furosemide and lisinopril in light of her renal insufficiency.  3. Acute renal failure, likely prerenal. As above, holding lisinopril and furosemide. Continue ins and outs and daily creatinine.  4. Myasthenic gravis. Continue Solu-Medrol and pyridostigmine. 5. Prophylaxis with Protonix drip, TEDs, SCDs.   TIME SPENT: Approximately 50 minutes on patient care.   ____________________________ Reuel DerbyAlounthith Taliana Mersereau, MD ap:cms D: 02/21/2012 23:25:12 ET T: 02/22/2012 09:04:04 ET JOB#: [id number]  cc: Pearlean BrownieAlounthith Adoni Greenough, MD, <Dictator> Leanna SatoLinda M. Miles, MD Reuel DerbyALOUNTHITH Jacilyn Sanpedro MD ELECTRONICALLY SIGNED 03/05/2012 0:43

## 2014-12-09 NOTE — Consult Note (Signed)
Chief Complaint:   Subjective/Chief Complaint One BM with minimal blood today. Denies any significant symptoms.   VITAL SIGNS/ANCILLARY NOTES: **Vital Signs.:   13-Jul-13 14:20   Vital Signs Type Q 4hr   Temperature Temperature (F) 98.8   Celsius 37.1   Temperature Source Oral   Pulse Pulse 61   Respirations Respirations 18   Systolic BP Systolic BP 312   Diastolic BP (mmHg) Diastolic BP (mmHg) 61   Mean BP 76   Pulse Ox % Pulse Ox % 96   Pulse Ox Activity Level  At rest   Oxygen Delivery 1L   Brief Assessment:   Additional Physical Exam Some what SOB. Abdomen is soft and benign.   Lab Results: Routine Chem:  13-Jul-13 03:57    Glucose, Serum 90   BUN  26   Creatinine (comp)  1.56   Sodium, Serum 143   Potassium, Serum 3.7   Chloride, Serum 106   CO2, Serum 31   Calcium (Total), Serum 8.7   Anion Gap  6   Osmolality (calc) 289   eGFR (African American)  37   eGFR (Non-African American)  31 (eGFR values <8m/min/1.73 m2 may be an indication of chronic kidney disease (CKD). Calculated eGFR is useful in patients with stable renal function. The eGFR calculation will not be reliable in acutely ill patients when serum creatinine is changing rapidly. It is not useful in  patients on dialysis. The eGFR calculation may not be applicable to patients at the low and high extremes of body sizes, pregnant women, and vegetarians.)  Routine Hem:  13-Jul-13 03:57    WBC (CBC) 8.0   RBC (CBC)  3.11   Hemoglobin (CBC)  8.8   Hematocrit (CBC)  26.9   Platelet Count (CBC) 189   MCV 87   MCH 28.2   MCHC 32.5   RDW  15.8   Neutrophil % 65.4   Lymphocyte % 22.5   Monocyte % 11.4   Eosinophil % 0.1   Basophil % 0.6   Neutrophil # 5.2   Lymphocyte # 1.8   Monocyte # 0.9   Eosinophil # 0.0   Basophil # 0.0 (Result(s) reported on 27 Feb 2012 at 04:30AM.)   Assessment/Plan:  Assessment/Plan:   Assessment Lower GI bleed prbably diverticular. No evidence of active GI  bleed. H and H stable. CHF. Cleared for colonoscopy by cardiology.    Plan Follow H and H. Colonoscopy Monday by Dr. SDonnella Sham   Electronic Signatures: IJill Side(MD)  (Signed 13-Jul-13 15:16)  Authored: Chief Complaint, VITAL SIGNS/ANCILLARY NOTES, Brief Assessment, Lab Results, Assessment/Plan   Last Updated: 13-Jul-13 15:16 by IJill Side(MD)

## 2014-12-09 NOTE — Consult Note (Signed)
Chief Complaint:   Subjective/Chief Complaint Please see flex sig result.  Impression is small volume/slow bleeding from the more proximal colon.  Unable to go beyond 35-40 cm due to prep.  Recommend full colonoscopy on monday, will prep over the weekend. However patient became marked short of breath when in a flat position, possible c/w chf?  Recommend further evaluation with cardiology clearance prior to sedated proceedure.  Discussed with Dr Eliane DecreeS. Patel.   Dr Niel HummerIftikhar following over the weekend.   Electronic Signatures: Barnetta ChapelSkulskie, Sheletha Bow (MD)  (Signed 12-Jul-13 17:16)  Authored: Chief Complaint   Last Updated: 12-Jul-13 17:16 by Barnetta ChapelSkulskie, Larae Caison (MD)

## 2014-12-09 NOTE — Consult Note (Signed)
Chief Complaint:   Subjective/Chief Complaint small amount of rectal bleeding/stool this am-?old/marroon.  no abdominal pain or nausea.   VITAL SIGNS/ANCILLARY NOTES: **Vital Signs.:   12-Jul-13 14:10   Vital Signs Type Q 4hr   Temperature Temperature (F) 97.9   Celsius 36.6   Pulse Pulse 56   Respirations Respirations 16   Systolic BP Systolic BP 407   Diastolic BP (mmHg) Diastolic BP (mmHg) 68   Mean BP 88   Pulse Ox % Pulse Ox % 96   Pulse Ox Activity Level  At rest   Oxygen Delivery 2L; Nasal Cannula  *Intake and Output.:   12-Jul-13 10:16   Stool  med amount soft stool,dark red/burgundy,small flecks/clots blood    11:17   Stool  dark red water with small black flecks- after enema   Brief Assessment:   Cardiac Regular    Respiratory clear BS    Gastrointestinal details normal Soft  Nontender  Nondistended  No masses palpable  Bowel sounds normal   Lab Results: Routine Chem:  12-Jul-13 04:45    Glucose, Serum 93   BUN  25   Creatinine (comp)  1.61   Sodium, Serum 143   Potassium, Serum 3.6   Chloride, Serum 106   CO2, Serum 31   Calcium (Total), Serum 8.7   Anion Gap  6   Osmolality (calc) 289   eGFR (African American)  35   eGFR (Non-African American)  30 (eGFR values <38m/min/1.73 m2 may be an indication of chronic kidney disease (CKD). Calculated eGFR is useful in patients with stable renal function. The eGFR calculation will not be reliable in acutely ill patients when serum creatinine is changing rapidly. It is not useful in  patients on dialysis. The eGFR calculation may not be applicable to patients at the low and high extremes of body sizes, pregnant women, and vegetarians.)  Routine Hem:  12-Jul-13 04:45    WBC (CBC) 7.5   RBC (CBC)  3.11   Hemoglobin (CBC)  8.6   Hematocrit (CBC)  27.1   Platelet Count (CBC) 186   MCV 87   MCH 27.6   MCHC  31.7   RDW  15.5   Neutrophil % 61.5   Lymphocyte % 27.2   Monocyte % 10.5   Eosinophil % 0.4    Basophil % 0.4   Neutrophil # 4.6   Lymphocyte # 2.1   Monocyte # 0.8   Eosinophil # 0.0   Basophil # 0.0 (Result(s) reported on 26 Feb 2012 at 05:26AM.)   Assessment/Plan:  Assessment/Plan:   Assessment 1) hematochezia-seems to be decreasing, old residual.  hemodynamically stable,  stable hgb-note some lower today?  2) myasthenia, htn, hyperlipidemia, arthritis.-stable.    Plan 1) flexible sigmoidoscopy today.  I have discussed the risks benefits and complication of flexible sigmoidoscopy to include not limited to bleeding infection perforation and sedation and she wishes to proceed.  further recs to follow.   Electronic Signatures: SLoistine Simas(MD)  (Signed 12-Jul-13 16:37)  Authored: Chief Complaint, VITAL SIGNS/ANCILLARY NOTES, Brief Assessment, Lab Results, Assessment/Plan   Last Updated: 12-Jul-13 16:37 by SLoistine Simas(MD)

## 2014-12-09 NOTE — Consult Note (Signed)
Chief Complaint:   Subjective/Chief Complaint tolerated prep for colonoscopy.  no c/o.  no abd pain or nausea.  no sob.   VITAL SIGNS/ANCILLARY NOTES: **Vital Signs.:   15-Jul-13 08:26   Vital Signs Type Q 4hr   Temperature Temperature (F) 98.8   Celsius 37.1   Temperature Source oral   Pulse Pulse 59   Respirations Respirations 16   Systolic BP Systolic BP 397   Diastolic BP (mmHg) Diastolic BP (mmHg) 72   Mean BP 95   Pulse Ox % Pulse Ox % 100   Pulse Ox Activity Level  At rest   Oxygen Delivery 1.5  *Intake and Output.:   15-Jul-13 01:43   Stool  Liquid stool.    06:01   Stool  Moderate amount of liquid stool.   Brief Assessment:   Cardiac Regular    Respiratory clear BS    Gastrointestinal details normal Soft  Nontender  Nondistended  No masses palpable  Bowel sounds normal   Lab Results: Cardiology:  07-Jul-13 20:12    ECG interpretation Normal sinus rhythm with sinus arrhythmia Cannot rule out Anterior infarct , age undetermined Abnormal ECG When compared with ECG of 15-Aug-2009 14:36, Vent. rate has decreased BY  30 BPM Criteria for Inferior infarct are no longer Present ----------unconfirmed---------- Confirmed by OVERREAD, NOT ([id number]), editor PEARSON, BARBARA ([id number]) on 02/22/2012 1:35:32 PM  Routine Chem:  13-Jul-13 03:57    Glucose, Serum 90   BUN  26   Creatinine (comp)  1.56   Sodium, Serum 143   Potassium, Serum 3.7   Chloride, Serum 106   CO2, Serum 31   Calcium (Total), Serum 8.7   Anion Gap  6   Osmolality (calc) 289   eGFR (African American)  37   eGFR (Non-African American)  31 (eGFR values <59m/min/1.73 m2 may be an indication of chronic kidney disease (CKD). Calculated eGFR is useful in patients with stable renal function. The eGFR calculation will not be reliable in acutely ill patients when serum creatinine is changing rapidly. It is not useful in  patients on dialysis. The eGFR calculation may not be applicable to patients at the low  and high extremes of body sizes, pregnant women, and vegetarians.)  Routine Hem:  11-Jul-13 05:29    Hemoglobin (CBC)  9.2  12-Jul-13 04:45    Hemoglobin (CBC)  8.6  13-Jul-13 03:57    WBC (CBC) 8.0   RBC (CBC)  3.11   Hemoglobin (CBC)  8.8   Hematocrit (CBC)  26.9   Platelet Count (CBC) 189   MCV 87   MCH 28.2   MCHC 32.5   RDW  15.8   Neutrophil % 65.4   Lymphocyte % 22.5   Monocyte % 11.4   Eosinophil % 0.1   Basophil % 0.6   Neutrophil # 5.2   Lymphocyte # 1.8   Monocyte # 0.9   Eosinophil # 0.0   Basophil # 0.0 (Result(s) reported on 27 Feb 2012 at 04:30AM.)   Assessment/Plan:  Assessment/Plan:   Assessment 1) hematochezia-stable.  source undetermined.    Plan 1) for colonoscopy today.  I have discussed the risks benefits and complications of egd to include not limited to bleding infection perforation and sedation and she wishes to proceed.   further recs to follow.   Electronic Signatures: SLoistine Simas(MD)  (Signed 15-Jul-13 12:46)  Authored: Chief Complaint, VITAL SIGNS/ANCILLARY NOTES, Brief Assessment, Lab Results, Assessment/Plan   Last Updated: 15-Jul-13 12:46 by SLoistine Simas(MD)

## 2014-12-09 NOTE — Consult Note (Signed)
Brief Consult Note: Diagnosis: gi bleed.   Patient was seen by consultant.   Consult note dictated.   Comments: Appreciate consult for 81 y/o [place] woman with history of Myasthenia Gravis, htn, HL, arthitis, for GI bleeding. Patient reports weight loss of 20lb over the last 3339m, loss of appetite, and onset of dark red stools yesterday: 2 yesterday, one today about 1245. Reports 2d of constipation last week, that resolved after increasing her vegetable intake, and some flatulence. Denies NSAIDS,  straining,  abdominal pain, NVD, other episodes of constipation, acid reflux, heartburn, indigestion, and all other GI related complaints. Did note last hgb of 9.7. State she has some mild sob with exertion, but no chest pain. Reports some arthritic type pain to both legs. States that her MG is well controlled: next appt with Dr Anne HahnWillis is in August.  No history of prior luminal evaluation Impression/Plan: painless GI bleeding and weight loss: Differentials include anal outlet v diverticular, less likely upper GI bleed, as BUN/Cr were elevated together, stools noted in chart as brown with red streaks on top.  Agree with serial hgb, PPI therapy- but will change to po bid. If she has significant bleeding, will need GI bleeding scan.  Will likely need endoscopic eval as clinically feasible, with history of weight loss/loss of appetite.  Would transfuse prn..  Electronic Signatures: Vevelyn PatLondon, Sakeenah Valcarcel H (NP)  (Signed 08-Jul-13 15:08)  Authored: Brief Consult Note   Last Updated: 08-Jul-13 15:08 by Keturah BarreLondon, Sehaj Mcenroe H (NP)

## 2014-12-09 NOTE — Consult Note (Signed)
PATIENT NAME:  [name] Duffy Duffy, [name] Duffy MR#:  [id number] DATE OF BIRTH:  [birth year]  DATE OF CONSULTATION:  02/22/2012  REFERRING PHYSICIAN:   CONSULTING PHYSICIAN:  Keturah Barrehristiane H. Waddell Iten, NP  PRIMARY CARE PHYSICIAN: Dr. Darreld McleanLinda Miles  [facility]   HISTORY OF PRESENT ILLNESS: [name] is a pleasant 81 year old African American woman with a history of hypertension, hyperlipidemia, myasthenia gravis who was admitted with bloody dark stools yesterday. GI has been consulted at the request of Dr. Margie EgePhichith for evaluation of GI bleeding. Patient reports today weight loss of over 20 pounds in the last two months, loss of appetite and onset of dark red stools yesterday; two yesterday, one today about 12:45. Reports two days of constipation last week that resolved after increasing her vegetable intake and some flatulence. Denies NSAIDS, straining, abdominal pain, nausea, vomiting, diarrhea, and other episodes constipation, acid reflux, heartburn, indigestion, all other GI-related complaints. Last hemoglobin 9.7. States that she has mild shortness of breath with exertion but no chest pain. Report some arthritic type pain to both legs. States that her myasthenia gravis is well controlled. Next appointment with Dr. Anne HahnWillis is in August. No history of prior luminal evaluation.   PAST MEDICAL HISTORY:  1. Hypertension.  2. Hyperlipidemia.  3. Myasthenia gravis. 4. Right lower lobe pneumonia July 2009.  5. Arthritis.   PAST SURGICAL HISTORY: Left cataract surgery.   ALLERGIES: Crestor.   MEDICATIONS: 1. Carvedilol 6.25 mg p.o. b.i.d.  2. Furosemide 60 mg p.o. daily.  3. Hydralazine 25 mg p.o. b.i.d.  4. Lisinopril 20 mg p.o. daily.  5. Acetaminophen 650 mg p.o. q.6 p.r.n.  6. Prednisone 10 mg p.o. daily.  7. Pyridostigmine 30 mg p.o. t.i.d.  8. Tramadol 50 q.6 p.r.n.  9. Xopenex 2 puffs q.4 hours p.r.n.   SOCIAL HISTORY: Lives with niece in [place]. Stopped smoking 30  years ago. No drugs or illicits.   FAMILY HISTORY: Pertinent for breast cancer, myocardial infarction. No family history of colorectal cancer, colon polyps, liver disease, ulcers.   REVIEW OF SYSTEMS: Ten-point review of systems is negative other than what is written above.   LABORATORY, DIAGNOSTIC AND RADIOLOGICAL DATA: Most recent lab work: Glucose 95, BUN 31, creatinine 1.27, sodium 146, potassium 3.8, chloride 109, GFR 48, calcium 9.1, magnesium 2.1, A1c 6.1. Hepatic panel is normal. WBC 8, hemoglobin 9.7, admit hemoglobin 10.8, hematocrit 31.8, platelets 213, red cells are normocytic with increased RDW. PT 12.7, INR 0.9.   PHYSICAL EXAMINATION:  VITAL SIGNS: Most recent vital signs: Temperature 98.1, pulse 65, respiratory rate 18, blood pressure 167/78, oxygen saturation 95% on room air.   GENERAL: Resting in bed in no acute distress, looks comfortable.   HEENT: Normocephalic, atraumatic. No redness, drainage, inflammation to the eyes or nares or mouth.   NECK: Soft, supple. No adenopathy or JVD.   RESPIRATORY: Respirations eupneic. Lungs CTAB.   CARDIOVASCULAR: S1, S2, regular rate and rhythm. No MRG. Has been sinus bradycardia on the monitor today. Peripheral pulses 2+. No appreciable edema.   ABDOMEN: Nondistended. Active bowel sounds x4. Very soft. No tenderness. No distention. No hepatosplenomegaly, hernias, rebound tenderness, or bruits, or other peritoneal signs.   GENITOURINARY: Deferred.   RECTAL: Deferred. Was heme-positive by ED physician.   EXTREMITIES: No appreciable edema, clubbing, cyanosis. Strength 5/5. Sensation appears to be intact.   SKIN: No erythema, lesion or rash.   NEUROLOGIC: Alert and oriented x3. Cranial nerves II through XII grossly intact. Speech clear. No facial droop.  PSYCHIATRIC: Pleasant, logical thought, good memory skills.   IMPRESSION AND PLAN: Painless GI bleeding and weight loss. Differentials include anal outlet bleeding versus  diverticular, less likely upper GI bleed as BUN and creatinine were elevated together on admission, have improved together. Stools noted in chart as brown with red streaks on top. Agree with serial hemoglobin, PPI therapy but will change from pantoprazole drip to 40 mg p.o. b.i.d. pantoprazole. If she has significant bleeding will need GI bleeding scan. Will likely need endoscopic evaluation as clinically feasible with history of weight loss and loss of appetite. Would transfuse p.r.n.   These services were provided by [name], MSN, [facility] in collaboration with Barnetta Chapel, M.D. with whom I have discussed this patient in full.  ____________________________ Keturah Barre, NP chl:cms D: 02/22/2012 18:12:24 ET T: 02/23/2012 05:55:43 ET JOB#: [id number]  cc: Keturah Barre, NP, <Dictator> Eustaquio Maize Caydence Enck FNP ELECTRONICALLY SIGNED 02/25/2012 19:35

## 2014-12-09 NOTE — Consult Note (Signed)
Chief Complaint:   Subjective/Chief Complaint patietn without c/o.  no abdominal pain or nausea.  on clears po.   VITAL SIGNS/ANCILLARY NOTES: **Vital Signs.:   11-Jul-13 09:31   Vital Signs Type Q 4hr   Temperature Temperature (F) 98.4   Celsius 36.8   Temperature Source oral   Pulse Pulse 58   Respirations Respirations 18   Systolic BP Systolic BP 99   Diastolic BP (mmHg) Diastolic BP (mmHg) 57   Mean BP 71   Pulse Ox % Pulse Ox % 99   Pulse Ox Activity Level  At rest   Oxygen Delivery 2L; Nasal Cannula  *Intake and Output.:   11-Jul-13 07:21   Stool  small black and bright red stool    09:03   Stool  small black and bright red stool   Brief Assessment:   Cardiac Regular    Respiratory clear BS    Gastrointestinal details normal Soft  Nontender  Nondistended  No masses palpable  Bowel sounds normal   Lab Results: Routine Hem:  07-Jul-13 17:07    Hemoglobin (CBC)  10.8  08-Jul-13 01:26    Hemoglobin (CBC)  10.5    09:40    Hemoglobin (CBC)  9.7 (Result(s) reported on 22 Feb 2012 at 10:20AM.)  09-Jul-13 04:23    Hemoglobin (CBC)  9.7    19:59    Hemoglobin (CBC)  9.8  10-Jul-13 04:02    Hemoglobin (CBC)  9.1    13:46    Hemoglobin (CBC)  9.7 (Result(s) reported on 24 Feb 2012 at 02:10PM.)    20:29    Hemoglobin (CBC)  9.5 (Result(s) reported on 24 Feb 2012 at 09:07PM.)  11-Jul-13 05:29    Hemoglobin (CBC)  9.2   Assessment/Plan:  Assessment/Plan:   Assessment 1) hematochezia-improving, with smaller amounts of stool passing, however with contued mix of medium marroon blood, minimal.  Hemodynamically stable, hgb stable.  Discussed with Dr Allena KatzPatel.  CT chowed evidence of "exuberant" diverticulosis.  I feel the bleeding is either very minimal or has stopped, and the residual is conimg out of the many diverticular pockets, clearing of the older blood being a much slower process.  Will prep patietn for a flexible sigmoidoscopy tomorrow.  I have discussed the risks  benefits and complications of flex sig to include not limited to bleeding infection and perforation and she wishes to proceed.    Plan as noted above.   Electronic Signatures: Barnetta ChapelSkulskie, Martin (MD)  (Signed 11-Jul-13 13:33)  Authored: Chief Complaint, VITAL SIGNS/ANCILLARY NOTES, Brief Assessment, Lab Results, Assessment/Plan   Last Updated: 11-Jul-13 13:33 by Barnetta ChapelSkulskie, Martin (MD)

## 2014-12-09 NOTE — Consult Note (Signed)
Chief Complaint:   Subjective/Chief Complaint hemodynamically stable, no n/v or abdominal pain. several bm this am with bloody mixed with stool.  smaller one this pm after lunch.   VITAL SIGNS/ANCILLARY NOTES: **Vital Signs.:   09-Jul-13 12:48   Vital Signs Type Routine   Temperature Temperature (F) 98.3   Celsius 36.8   Temperature Source Oral   Pulse Pulse 81   Respirations Respirations 20   Systolic BP Systolic BP 151   Diastolic BP (mmHg) Diastolic BP (mmHg) 69   Mean BP 96   Pulse Ox % Pulse Ox % 91   Pulse Ox Activity Level  At rest   Oxygen Delivery 2L  *Intake and Output.:   09-Jul-13 07:54   Stool  large bloody loose stool    09:40   Stool  moderate loose bloody stool    10:33   Stool  small loose bloody   Brief Assessment:   Cardiac Irregular    Respiratory clear BS    Gastrointestinal details normal Soft  Nontender  Nondistended  No masses palpable  Bowel sounds normal   Assessment/Plan:  Assessment/Plan:   Assessment 1) hematochezia-diverticular versus anal outlet bleeding, likely not active at this point.  tolerating clears, no pain, no repeat hgb since this am.  no previous colonoscopy.    Plan 1) will recheck hgb this evening.  continue current observation.  colonoscopy as o/p in several weeks unless there is a clinical change.   Electronic Signatures: Barnetta ChapelSkulskie, Damyra Luscher (MD)  (Signed 09-Jul-13 17:34)  Authored: Chief Complaint, VITAL SIGNS/ANCILLARY NOTES, Brief Assessment, Assessment/Plan   Last Updated: 09-Jul-13 17:34 by Barnetta ChapelSkulskie, Yates Weisgerber (MD)

## 2014-12-09 NOTE — Consult Note (Signed)
Chief Complaint:   Subjective/Chief Complaint several bloody appearing stool this am.  no abdominal pain or nausea.   VITAL SIGNS/ANCILLARY NOTES: **Vital Signs.:   10-Jul-13 17:41   Vital Signs Type Q 4hr   Temperature Temperature (F) 97.5   Celsius 36.3   Temperature Source Oral   Pulse Pulse 84   Respirations Respirations 18   Systolic BP Systolic BP 299   Diastolic BP (mmHg) Diastolic BP (mmHg) 71   Mean BP 98   Pulse Ox % Pulse Ox % 96   Pulse Ox Activity Level  At rest   Oxygen Delivery 2L  *Intake and Output.:   10-Jul-13 00:00   Stool  bloody mixed with urine total=200 ml    08:56   Stool  bloody mix    09:36   Stool  large bloody mix black and red watery stool   Brief Assessment:   Cardiac Irregular    Respiratory clear BS    Gastrointestinal details normal Soft  Nontender  Nondistended  No masses palpable  Bowel sounds normal    Additional Physical Exam DRE-thin watery effluent, no stool, empty vault.  effluent maroon, old in appearance.   Lab Results: Routine Chem:  10-Jul-13 04:02    Glucose, Serum 93   BUN  19   Creatinine (comp)  1.34   Sodium, Serum  146   Potassium, Serum 3.6   Chloride, Serum  111   CO2, Serum 29   Calcium (Total), Serum 8.9   Anion Gap  6   Osmolality (calc) 293   eGFR (African American)  44   eGFR (Non-African American)  38 (eGFR values <12m/min/1.73 m2 may be an indication of chronic kidney disease (CKD). Calculated eGFR is useful in patients with stable renal function. The eGFR calculation will not be reliable in acutely ill patients when serum creatinine is changing rapidly. It is not useful in  patients on dialysis. The eGFR calculation may not be applicable to patients at the low and high extremes of body sizes, pregnant women, and vegetarians.)  Routine Hem:  07-Jul-13 17:07    Hemoglobin (CBC)  10.8  08-Jul-13 01:26    Hemoglobin (CBC)  10.5    09:40    Hemoglobin (CBC)  9.7 (Result(s) reported on 22 Feb 2012 at 10:20AM.)  09-Jul-13 04:23    Hemoglobin (CBC)  9.7    19:59    Hemoglobin (CBC)  9.8  10-Jul-13 04:02    Hemoglobin (CBC)  9.1   WBC (CBC) 8.4   RBC (CBC)  3.27   Hematocrit (CBC)  28.1   Platelet Count (CBC) 202   MCV 86   MCH 27.8   MCHC 32.3   RDW  16.1   Neutrophil % 62.8   Lymphocyte % 25.7   Monocyte % 10.7   Eosinophil % 0.3   Basophil % 0.5   Neutrophil # 5.3   Lymphocyte # 2.2   Monocyte # 0.9   Eosinophil # 0.0   Basophil # 0.0 (Result(s) reported on 24 Feb 2012 at 04:44AM.)    13:46    Hemoglobin (CBC)  9.7 (Result(s) reported on 24 Feb 2012 at 02:10PM.)   Assessment/Plan:  Assessment/Plan:   Assessment 1) hematochezia-hemodynamically stable.  stable hemoglobin despite several bm with bloody appearance.  bleeding scan negative for active bleeding, but atypical appearance in the presumed transverse region.  CT with contrast negative for colitis, positive for marked diverticulosis.    Plan 1) no apparent active bleeding.  no overt  colitis.  likely diverticular bleeding versus anal outlet. will recheck hgb this evening.  may do flex sig tomorrow to evaluate the anal outlet. discussed with patient and family.   Electronic Signatures: Loistine Simas (MD)  (Signed 10-Jul-13 19:22)  Authored: Chief Complaint, VITAL SIGNS/ANCILLARY NOTES, Brief Assessment, Lab Results, Assessment/Plan   Last Updated: 10-Jul-13 19:22 by Loistine Simas (MD)

## 2014-12-09 NOTE — Consult Note (Signed)
Chief Complaint:   Subjective/Chief Complaint Overall same. No significant bleeding.   VITAL SIGNS/ANCILLARY NOTES: **Vital Signs.:   14-Jul-13 13:55   Vital Signs Type Q 4hr   Temperature Temperature (F) 98.5   Celsius 36.9   Temperature Source Oral   Pulse Pulse 63   Respirations Respirations 18   Systolic BP Systolic BP 108   Diastolic BP (mmHg) Diastolic BP (mmHg) 61   Mean BP 76   Pulse Ox % Pulse Ox % 97   Pulse Ox Activity Level  At rest   Oxygen Delivery 1L   Assessment/Plan:  Assessment/Plan:   Assessment Mild rectal bleeding. Colonoscopy planned for am by Dr. Ricki RodriguezSkulski. Cardiology has cleared her for the procedure citing mild to moderate risk.    Plan Colonoscopy in am. Dr. Ricki RodriguezSkulski will resume care in am.   Electronic Signatures: Lurline DelIftikhar, Bernadett Milian (MD)  (Signed 14-Jul-13 14:05)  Authored: Chief Complaint, VITAL SIGNS/ANCILLARY NOTES, Assessment/Plan   Last Updated: 14-Jul-13 14:05 by Lurline DelIftikhar, Tayler Lassen (MD)

## 2018-01-15 DEATH — deceased
